# Patient Record
Sex: Female | Born: 1935 | Race: White | Hispanic: No | Smoking: Former smoker
Health system: Southern US, Community
[De-identification: ages and names within clinical notes are randomized; demographics above are authoritative.]

## PROBLEM LIST (undated history)

## (undated) DIAGNOSIS — K220 Achalasia of cardia: Secondary | ICD-10-CM

## (undated) DIAGNOSIS — E7800 Pure hypercholesterolemia, unspecified: Secondary | ICD-10-CM

## (undated) DIAGNOSIS — E538 Deficiency of other specified B group vitamins: Secondary | ICD-10-CM

## (undated) DIAGNOSIS — I10 Essential (primary) hypertension: Secondary | ICD-10-CM

## (undated) DIAGNOSIS — G629 Polyneuropathy, unspecified: Secondary | ICD-10-CM

## (undated) DIAGNOSIS — E079 Disorder of thyroid, unspecified: Secondary | ICD-10-CM

## (undated) DIAGNOSIS — E559 Vitamin D deficiency, unspecified: Secondary | ICD-10-CM

## (undated) DIAGNOSIS — R609 Edema, unspecified: Secondary | ICD-10-CM

## (undated) HISTORY — DX: Vitamin D deficiency, unspecified: E55.9

## (undated) HISTORY — DX: Achalasia of cardia: K22.0

## (undated) HISTORY — PX: BUNIONECTOMY: SHX129

## (undated) HISTORY — DX: Deficiency of other specified B group vitamins: E53.8

## (undated) HISTORY — PX: PARTIAL HYSTERECTOMY: SHX80

## (undated) HISTORY — DX: Essential (primary) hypertension: I10

## (undated) HISTORY — DX: Polyneuropathy, unspecified: G62.9

## (undated) HISTORY — DX: Edema, unspecified: R60.9

---

## 2000-05-19 ENCOUNTER — Emergency Department (HOSPITAL_COMMUNITY): Admission: EM | Admit: 2000-05-19 | Discharge: 2000-05-19 | Payer: Self-pay | Admitting: Emergency Medicine

## 2000-05-22 ENCOUNTER — Emergency Department (HOSPITAL_COMMUNITY): Admission: EM | Admit: 2000-05-22 | Discharge: 2000-05-22 | Payer: Self-pay | Admitting: Emergency Medicine

## 2000-05-26 ENCOUNTER — Encounter (HOSPITAL_COMMUNITY): Admission: RE | Admit: 2000-05-26 | Discharge: 2000-08-24 | Payer: Self-pay | Admitting: *Deleted

## 2000-12-21 ENCOUNTER — Other Ambulatory Visit: Admission: RE | Admit: 2000-12-21 | Discharge: 2000-12-21 | Payer: Self-pay | Admitting: Radiology

## 2001-02-10 ENCOUNTER — Ambulatory Visit (HOSPITAL_BASED_OUTPATIENT_CLINIC_OR_DEPARTMENT_OTHER): Admission: RE | Admit: 2001-02-10 | Discharge: 2001-02-10 | Payer: Self-pay | Admitting: Surgery

## 2001-04-19 ENCOUNTER — Other Ambulatory Visit: Admission: RE | Admit: 2001-04-19 | Discharge: 2001-04-19 | Payer: Self-pay | Admitting: *Deleted

## 2006-04-22 ENCOUNTER — Other Ambulatory Visit: Admission: RE | Admit: 2006-04-22 | Discharge: 2006-04-22 | Payer: Self-pay | Admitting: Obstetrics & Gynecology

## 2007-03-07 ENCOUNTER — Ambulatory Visit: Payer: Self-pay | Admitting: Pulmonary Disease

## 2007-03-24 ENCOUNTER — Ambulatory Visit: Payer: Self-pay | Admitting: Pulmonary Disease

## 2009-02-13 ENCOUNTER — Encounter: Admission: RE | Admit: 2009-02-13 | Discharge: 2009-02-13 | Payer: Self-pay | Admitting: Endocrinology

## 2011-04-23 NOTE — Assessment & Plan Note (Signed)
[facility]                             PULMONARY OFFICE NOTE   DESHON, KOSLOWSKI                         MRN:          [id number]  DATE:03/07/2007                            DOB:          1933/05/23    SLEEP MEDICINE CONSULTATION:   HISTORY OF PRESENT ILLNESS:  The patient is a 77 year old white female  whom I have been asked to see for persistent shortness of breath and  cough.  The patient was in her usual state of health until January of  this year when she started with a cough, which eventually became  productive of scant yellow-colored mucus.  She had no shortness of  breath at that time.  By her description it sounded a lot like an acute  viral illness.  The patient was treated with Avelox as well as a shot of  cortisone and had a negative chest x-ray at that time.  She did not see  significant improvement and was eventually seen again and placed on a  Medrol Dosepak for approximately 5 days as well as p.r.n. albuterol.  With this she felt somewhat better and then she began to have a return  of her symptoms.  She most recently was given another cortisone shot and  later placed on Symbicort, which she has been on x1 day.  The patient  states that her current symptoms consist of a dry cough with very scant  mucus, which is more hacky in nature.  She has noticed worsening  shortness of breath over the last few weeks, and this has gotten worse  over this past weekend.  She feels that this is a progressive issue.  The patient does have significant postnasal drip but no history of  chronic sinus disease or allergies.  She has occasional GERD symptoms  and has had a recent upper endoscopy by Dr. Matthias Hughs without significant  esophagitis or stricture.  She has had no history of fevers, chills or  sweats and no history of asthma in the past.   PAST MEDICAL HISTORY:  1. Hypertension.  2. History of dyslipidemia.  3. Otherwise is unremarkable.   MEDICATIONS:  1. Symbicort of unknown strength b.i.d.  2. Calcium, vitamin D and multivitamin daily.  3. Vytorin 10/40 mg daily.  4. Altace 10 mg daily.  5. Synthroid of unknown dose daily.  6. Diovan of unknown dose daily.   The patient has no known drug allergies.   SOCIAL HISTORY:  She is married and has children.  She is a retired  Camera operator.  She has a history of smoking 5 cigarettes a day for  10 years.  She has not smoked since age 56.   FAMILY HISTORY:  Remarkable for mother and father having heart disease.  Her father also had laryngeal cancer.   REVIEW OF SYSTEMS:  As per history of present illness.  Also see patient  intake form documented in the chart.   PHYSICAL EXAMINATION:  GENERAL:  She is a mildly overweight female in no  acute distress.  VITAL SIGNS:  Blood pressure is 116/74, pulse  88, temperature is 98,  weight is 156 pounds.  O2 saturation on room air is 98%.  HEENT:  Pupils equal, round and reactive to light and accommodation.  Extraocular muscles were intact.  Nares were patent without discharge.  Oropharynx is clear.  NECK:  Supple without JVD or lymphadenopathy.  There is no palpable  thyromegaly.  CHEST:  Totally clear to auscultation.  CARDIAC:  Regular rate and rhythm.  ABDOMEN:  Soft, nontender, with good bowel sounds.  GENITAL, RECTAL, BREASTS:  Exams not done and not indicated.  EXTREMITIES:  Lower extremities are without edema.  Pulses are intact  distally.  NEUROLOGIC:  Alert and oriented, no obvious observable motor defects.   LABORATORY DATA:  Chest CT was reviewed from [facility],  where there was no infiltrate, acute process, or lymphadenopathy.  There  was a tiny 4 mm density in the apical segment of the left lower lobe.  Attempts were made at spirometry today in the office with very poor test  performance because of coughing with essentially interpretable results.   IMPRESSION:  Questionable postviral  bronchiolitis.  It is really unclear  how much of her issue is from the upper airway versus the lower airway.  It certainly sounds that she had a viral illness of some type, and her  symptoms have been at least somewhat responsive to prednisone.  Both  upper airway and lower airway inflammation would respond to this.  I  have no way of knowing if she has obstructive lung disease since the  spirometry was difficult for her because of coughing.  This may simply  be all upper airway, propagated by a cyclical cough mechanism,  laryngopharyngeal reflux related to her worsening cough, as well as  stimulation of the upper airway related to Altace.  She has no obvious  infiltrate and nothing to indicate that she needs further antibiotics.  At this point in time I would like to treat her with another course of  prednisone over approximately 8 days and also try to minimize  stimulation to her upper airway as much as possible with a proton pump  inhibitor and discontinuing her ACE inhibitor for now.  We will see how  the patient responds over the next 3 weeks.   PLAN:  1. Initiate Zegerid 40 mg p.o. q.h.s. for the next 3 weeks.  2. Discontinue Altace for now.  3. Prednisone taper over the next 8 days.  4. The patient was will follow up in 3 weeks or sooner if there are      problems.    [name], MD,FCCP  Electronically Signed   [facility]  DD: 03/07/2007  DT: 03/08/2007  Job #: [id number]   cc:   Tera Mater. Evlyn Kanner, M.D.

## 2011-04-23 NOTE — Op Note (Signed)
[place]. [facility]  Patient:    Tami Cabrera, Tami Cabrera                         MRN: [id number] Proc. Date: 02/10/01 Adm. Date:  [id number] Attending:  Charlton Haws CC:         Tera Mater. Evlyn Kanner, M.D.             Andres Ege, M.D.             Jeralyn Ruths, M.D.                           Operative Report  CCS# [id number]  PREOPERATIVE DIAGNOSIS:  Nipple discharge right.  POSTOPERATIVE DIAGNOSIS:  Nipple discharge right.  OPERATION PERFORMED:  Excision, ductal tissue right breast.  SURGEON:  Currie Paris, M.D.  ANESTHESIA:  General.  INDICATIONS FOR PROCEDURE:  The patient is a 77 year old lady with significant discharge from a particular duct in the right breast although she has had nipple discharge from multiple ducts in both breasts.  The 9 oclock positino has been somewhat worrisome.  DESCRIPTION OF PROCEDURE:  The patient was brought to the operating room and the area in question was identified and the breast manipulated to extract a little bit of nipple discharge and the area marked.  She was then given a satisfactory general anesthesia (LMA).  The breast was prepped and draped as a sterile field.  I was able to get a small tear duct probe into the aforementioned duct and with a little manipulation was actually able to get all the way to the hilt.  It tracked laterally from this 9 oclock position.  A curvilinear incision was made at the areolar margin and the duct identified. I divided the tissue with cautery around the tear duct probe and was able to palpate the tear duct probe in its general direction in the breast.  I disconnected this tissue from the underside of the nipple and then withdrew the tear duct probe.  I attempted to get it back into the duct after I disconnected it but was unable to rethread it back in, but had already identified visually the area involved.  Using cutting current of the cautery, I took a wide  excision of tissue almost to the chest wall going posteriorly and laterally around the area involved. Almost all of this tissue appeared to be fatty fibrous tissue with strands of breast tissue interspersed.  This specimen was oriented by putting a suture at the nipple end.  I had noticed some more thickened material there as we cut across the duct than the watery thin discharge that had been coming out of the nipple itself. Inspection of the undersurface of the nipple still revealed what to me looked like some abnormal ductal tissue and using the knife, I excised a biopsy of this tissue getting through the skin in one point which was then closed.  This actually had some very thick creamy material in it somewhat worrisome for DCIS comedo type material although it could well also represent just some severe fibrocystic change.  The wounds were closed.  I reclosed the nipple with a little 4-0 subcuticular Monocryl.  The breast was checked for hemostasis and when dry was closed with 3-0 Vicryl followed by 4-0 Monocryl subcuticular.  Sterile dressings were applied.  The patient tolerated the procedure well.  There were no operative complications.  All  counts were correct. DD:  02/10/01 TD:  02/10/01 Job: [id number]

## 2011-04-23 NOTE — Assessment & Plan Note (Signed)
[facility]                             PULMONARY OFFICE NOTE   Tami Cabrera, Tami Cabrera                         MRN:          [id number]  DATE:03/24/2007                            DOB:          [birth year]    SUBJECTIVE:  Tami Cabrera comes in today for follow-up after her last  consultation where she is having cough and other pulmonary symptoms.  It  was unclear at that time whether she had a post viral bronchiolitis or  how much that upper airway dysfunction and irritation could be adding to  her symptoms.  Patient was started on Zegerid 40 mg nightly, asked her  to discontinue the Altace and also placed on prednisone taper over  approximately a week.  She returns today where she is doing much, much  better.  Her cough is improved at last 80% and she is satisfied with the  direction in which it is going.  She has no shortness of breath.   PHYSICAL EXAMINATION:  VITAL SIGNS:  Blood pressure 104/62, pulse 77,  temperature 98.1, weight 158 pounds, O2 saturation on room air 98%.  CHEST:  Totally clear.  CARDIOVASCULAR:  Regular rate and rhythm.   IMPRESSION:  1. Cough and other upper airway symptoms that I suspect was primarily      secondary to reflux disease and the ACE inhibitor.  There may have      also been an element of post viral bronchiolitis.  At this point in      time, the patient is at least 80% improved.  I think she needs to      stay off her ACE inhibitor and also stay on the Zegerid for the      next 2-3 weeks to give her a good 6-8 weeks course.  2. Very small pleural based density of 4 mm in the superior segment of      the left lower lobe.  I have had a long discussion with the patient      about this and feel that she is at extremely low risk.  The patient      would like to follow this up for a short period of time just for      her own piece of mind.  Again, I have explained to her it is      unlikely to amount to anything but we will  follow it over the next      one year.   PLAN:  1. Stay on Zegerid 40 mg daily for the next 2-3 weeks and then      discontinue.  If she sees her symptoms starting to return, she      needs to get back on the medication.  2. The patient will call me in August to set up a limited CT scan of      the chest in September of this year to follow up her left lower      lobe nodule.     Tami Share, MD,FCCP  Electronically Signed    [facility]  DD:  03/29/2007  DT: 03/29/2007  Job #: [id number]   cc:   Jeannett Senior A. Evlyn Kanner, M.D.

## 2012-08-27 ENCOUNTER — Ambulatory Visit (INDEPENDENT_AMBULATORY_CARE_PROVIDER_SITE_OTHER): Payer: Medicare Other | Admitting: Family Medicine

## 2012-08-27 ENCOUNTER — Ambulatory Visit: Payer: Medicare Other

## 2012-08-27 VITALS — BP 114/57 | HR 70 | Temp 97.5°F | Resp 16 | Ht 64.5 in | Wt 147.0 lb

## 2012-08-27 DIAGNOSIS — S93609A Unspecified sprain of unspecified foot, initial encounter: Secondary | ICD-10-CM

## 2012-08-27 DIAGNOSIS — M7989 Other specified soft tissue disorders: Secondary | ICD-10-CM

## 2012-08-27 NOTE — Patient Instructions (Addendum)
Foot Sprain  The muscles and cord like structures which attach muscle to bone (tendons) that surround the feet are made up of units. A foot sprain can occur at the weakest spot in any of these units. This condition is most often caused by injury to or overuse of the foot, as from playing contact sports, or aggravating a previous injury, or from poor conditioning, or obesity.  SYMPTOMS  · Pain with movement of the foot.  · Tenderness and swelling at the injury site.  · Loss of strength is present in moderate or severe sprains.  THE THREE GRADES OR SEVERITY OF FOOT SPRAIN ARE:  · Mild (Grade I): Slightly pulled muscle without tearing of muscle or tendon fibers or loss of strength.  · Moderate (Grade II): Tearing of fibers in a muscle, tendon, or at the attachment to bone, with small decrease in strength.  · Severe (Grade III): Rupture of the muscle-tendon-bone attachment, with separation of fibers. Severe sprain requires surgical repair. Often repeating (chronic) sprains are caused by overuse. Sudden (acute) sprains are caused by direct injury or over-use.  DIAGNOSIS   Diagnosis of this condition is usually by your own observation. If problems continue, a caregiver may be required for further evaluation and treatment. X-rays may be required to make sure there are not breaks in the bones (fractures) present. Continued problems may require physical therapy for treatment.  PREVENTION  · Use strength and conditioning exercises appropriate for your sport.  · Warm up properly prior to working out.  · Use athletic shoes that are made for the sport you are participating in.  · Allow adequate time for healing. Early return to activities makes repeat injury more likely, and can lead to an unstable arthritic foot that can result in prolonged disability. Mild sprains generally heal in 3 to 10 days, with moderate and severe sprains taking 2 to 10 weeks. Your caregiver can help you determine the proper time required for  healing.  HOME CARE INSTRUCTIONS   · Apply ice to the injury for 15 to 20 minutes, 3 to 4 times per day. Put the ice in a plastic bag and place a towel between the bag of ice and your skin.  · An elastic wrap (like an Ace bandage) may be used to keep swelling down.  · Keep foot above the level of the heart, or at least raised on a footstool, when swelling and pain are present.  · Try to avoid use other than gentle range of motion while the foot is painful. Do not resume use until instructed by your caregiver. Then begin use gradually, not increasing use to the point of pain. If pain does develop, decrease use and continue the above measures, gradually increasing activities that do not cause discomfort, until you gradually achieve normal use.  · Use crutches if and as instructed, and for the length of time instructed.  · Keep injured foot and ankle wrapped between treatments.  · Massage foot and ankle for comfort and to keep swelling down. Massage from the toes up towards the knee.  · Only take over-the-counter or prescription medicines for pain, discomfort, or fever as directed by your caregiver.  SEEK IMMEDIATE MEDICAL CARE IF:   · Your pain and swelling increase, or pain is not controlled with medications.  · You have loss of feeling in your foot or your foot turns cold or blue.  · You develop new, unexplained symptoms, or an increase of the symptoms that brought you   to your caregiver.  MAKE SURE YOU:   · Understand these instructions.  · Will watch your condition.  · Will get help right away if you are not doing well or get worse.  Document Released: 05/14/2002 Document Revised: 11/11/2011 Document Reviewed: 07/11/2008  ExitCare® Patient Information ©2012 [employer].

## 2012-08-27 NOTE — Progress Notes (Signed)
78 yo woman who twisted foot one week ago but the foot has remained swollen.  There is not a lot of pain.   The foot has turned black and blue and is tender on the dorsal, proximal foot.  Objective:  NAD 2 to 3+ foot edema with obvious ecchymosis distal dorsal foot and middle three toes FROM of foot Tender over cuboid bone dorsally Good pulse on DP [facility] reading (PRIMARY) by  Dr. Milus Glazier:  Right foot. No fracture  Assessment:  Foot sprain, right.    Plan:  Wear supportive shoes.  I don't think ibuprofen or other anti inflammatory agents are necessary

## 2014-03-13 ENCOUNTER — Telehealth: Payer: Self-pay | Admitting: Neurology

## 2014-03-13 NOTE — Telephone Encounter (Signed)
Called patient back and left message. This is not a patient of our office. I believe this is a patient's wife. Awaiting call back.

## 2014-03-13 NOTE — Telephone Encounter (Signed)
Calling for her husband - please document in his chart. She is not a patient here.

## 2014-03-13 NOTE — Telephone Encounter (Signed)
Pt wants to talk to you asap please call her at [phone]

## 2014-03-13 NOTE — Telephone Encounter (Signed)
Returning your call. °

## 2014-05-17 ENCOUNTER — Encounter (HOSPITAL_COMMUNITY): Payer: Self-pay | Admitting: Emergency Medicine

## 2014-05-17 ENCOUNTER — Emergency Department (HOSPITAL_COMMUNITY)
Admission: EM | Admit: 2014-05-17 | Discharge: 2014-05-17 | Disposition: A | Discharge status: Home / Self Care | Payer: Medicare Other | Attending: Emergency Medicine | Admitting: Emergency Medicine

## 2014-05-17 DIAGNOSIS — Z203 Contact with and (suspected) exposure to rabies: Secondary | ICD-10-CM

## 2014-05-17 DIAGNOSIS — Z7982 Long term (current) use of aspirin: Secondary | ICD-10-CM | POA: Insufficient documentation

## 2014-05-17 DIAGNOSIS — Z23 Encounter for immunization: Secondary | ICD-10-CM | POA: Insufficient documentation

## 2014-05-17 DIAGNOSIS — Z87891 Personal history of nicotine dependence: Secondary | ICD-10-CM | POA: Insufficient documentation

## 2014-05-17 MED ORDER — RABIES VACCINE, PCEC IM SUSR
1.0000 mL | Freq: Once | INTRAMUSCULAR | Status: AC
  Administered 2014-05-17: 1 mL via INTRAMUSCULAR
  Filled 2014-05-17: qty 1

## 2014-05-17 NOTE — Discharge Instructions (Signed)
You need to have rabies vaccine again in 3 days Rabies  Rabies is a viral infection that can be spread to people from infected animals. The infection affects the brain and central nervous system. Once the disease develops, it almost always causes death. Because of this, when a person is bitten by an animal that may have rabies, treatment to prevent rabies often needs to be started whether or not the animal is known to be infected. Prompt treatment with the rabies vaccine and rabies immune globulin is very effective at preventing the infection from developing in people who have been exposed to the rabies virus. CAUSES  Rabies is caused by a virus that lives inside some animals. When a person is bitten by an infected animal, the rabies virus is spread to the person through the infected spit (saliva) of the animal. This virus can be carried by animals such as dogs, cats, skunks, bats, woodchucks, raccoons, coyotes, and foxes. SYMPTOMS  By the time symptoms appear, rabies is usually fatal for the person. Common symptoms include:  Headache.  Fever.  Fatigue and weakness.  Agitation.  Anxiety.  Confusion.  Unusual behavior, such as hyperactivity, fear of water (hydrophobia), or fear of air (aerophobia).  Hallucinations.  Insomnia.  Weakness in the arms or legs.  Difficulty swallowing. Most people get sick in 1 3 months after being bitten. This often varies and may depend on the location of the bite. The infection will take less time to develop if the bite occurred closer to the head.  DIAGNOSIS  To determine if a person is infected, several tests must be performed, such as:  A skin biopsy.  A saliva test.  A lumbar puncture to remove spinal fluid so it can be examined.  Blood tests. TREATMENT  Treatment to prevent the infection from developing (post-exposure prophylaxis, PEP) is often started before knowing for sure if the person has been exposed to the rabies virus. PEP involves  cleaning the wound, giving an antibody injection (rabies immune globulin), and giving a series of rabies vaccine injections. The series of injections are usually given over a two-week period. If possible, the animal that bit the person will be observed to see if it remains healthy. If the animal has been killed, it can be sent to a state laboratory and examined to see if the animal had rabies. If a person is bitten by a domestic animal (dog, cat, or ferret) that appears healthy and can be observed to see if it remains healthy, often no further treatment is necessary other than care of the wounds caused by the animal. Rabies is often a fatal illness once the infection develops in a person. Although a few people who developed rabies have survived after experimental treatment with certain drugs, all these survivors still had severe nervous system problems after the treatment. This is why caregivers use extra caution and begin PEP treatment for people who have been bitten by animals that are possibly infected with rabies.  HOME CARE INSTRUCTIONS  If you were bitten by an unknown animal, make sure you know your caregiver's instructions for follow-up. If the animal was sent to a laboratory for examination, ask when the test results will be ready. Make sure you get the test results.  Take these steps to care for your wound:  Keep the wound clean, dry, and dressed as directed by your caregiver.  Keep the injured part elevated as much as possible.  Do not resume use of the affected area until  directed.  Only take over-the-counter or prescription medicines as directed by your caregiver.  Keep all follow-up appointments as directed by your caregiver. PREVENTION  To prevent rabies, people need to reduce their risk of having contact with infected animals.   Make sure your pets (dogs, cats, ferrets) are vaccinated against rabies. Keep these vaccinations up-to-date as directed by your veterinarian.  Supervise  your pets when they are outside. Keep them away from wild animals.  Call your local animal control services to report any stray animals. These animals may not be vaccinated.  Stay away from stray or wild animals.  Consider getting the rabies vaccine (preexposure) if you are traveling to an area where rabies is common or if your job or activities involve possible contact with wild or stray animals. Discuss this with your caregiver. Document Released: 11/22/2005 Document Revised: 08/16/2012 Document Reviewed: 06/20/2012 [facility] Patient Information 2014 [place], [place].

## 2014-05-17 NOTE — ED Provider Notes (Signed)
Medical screening examination/treatment/procedure(s) were performed by non-physician practitioner and as supervising physician I was immediately available for consultation/collaboration.   EKG Interpretation None        Junius ArgyleForrest S Fin Hupp, MD 05/17/14 1845

## 2014-05-17 NOTE — ED Notes (Addendum)
Pt reports that she has seen bats in her home for the past two weeks. Pt reports seeing two dead bats in her basement and another flying in her bedroom. Pt reports a history of being immunized for rabies [facility]. Pt is A/O x4, in NAD, and vitals are WDL.

## 2014-05-17 NOTE — ED Provider Notes (Signed)
CSN: [id number]     Arrival date & time 05/17/14  1635 History  This chart was scribed for non-physician practitioner, Teressa LowerVrinda Maresa Morash, FNP,working with Junius ArgyleForrest S Harrison, MD, by Karle PlumberJennifer Tensley, ED Scribe.  This patient was seen in room WTR7/WTR7 and the patient's care was started at 4:55 PM.  Chief Complaint  Patient presents with  . Rabies Injection   The history is provided by the patient. No language interpreter was used.   HPI Comments:  Tami Cabrera is a 80 y.o. female who presents to the Emergency Department complaining of being exposed to bats in the past two weeks. Pt states she found some dead bats in her basement and one live one in her bedroom. She states she was vaccinated previously for a bat exposure but does not specify when. She denies fever, nausea, or vomiting.  History reviewed. No pertinent past medical history. History reviewed. No pertinent past surgical history. No family history on file. History  Substance Use Topics  . Smoking status: Former Smoker    Quit date: 08/27/1977  . Smokeless tobacco: Not on file  . Alcohol Use: Not on file   OB History   Grav Para Term Preterm Abortions TAB SAB Ect Mult Living                 Review of Systems  Constitutional: Negative for fever.  Gastrointestinal: Negative for nausea and vomiting.  All other systems reviewed and are negative.   Allergies  Review of patient's allergies indicates no known allergies.  Home Medications   Prior to Admission medications   Medication Sig Start Date End Date Taking? Authorizing Provider  aspirin 325 MG tablet Take 325 mg by mouth daily.    Historical Provider, MD  Ezetimibe-Simvastatin (VYTORIN PO) Take by mouth.    Historical Provider, MD  Levothyroxine Sodium (SYNTHROID PO) Take by mouth.    Historical Provider, MD  Valsartan (DIOVAN PO) Take by mouth.    Historical Provider, MD   Triage Vitals: BP 121/58  Pulse 73  Temp(Src) 98.9 F (37.2 C) (Oral)  Resp 18  SpO2  98% Physical Exam  Nursing note and vitals reviewed. Constitutional: She is oriented to person, place, and time. She appears well-developed and well-nourished.  HENT:  Head: Normocephalic and atraumatic.  Eyes: EOM are normal.  Neck: Normal range of motion.  Cardiovascular: Normal rate.   Pulmonary/Chest: Effort normal.  Musculoskeletal: Normal range of motion.  Neurological: She is alert and oriented to person, place, and time.  Skin: Skin is warm and dry.  Psychiatric: She has a normal mood and affect. Her behavior is normal.    ED Course  Procedures (including critical care time) DIAGNOSTIC STUDIES: Oxygen Saturation is 98% on RA, normal by my interpretation.   COORDINATION OF CARE: 4:57 PM- Will administer the rabies vaccine. Pt verbalizes understanding and agrees to plan.  Medications - No data to display  Labs Review Labs Reviewed - No data to display  Imaging Review No results found.   EKG Interpretation None      MDM   Final diagnoses:  Rabies exposure   Pt to receive 2 vaccines as she has already been vaccinated before. No known bites  I personally performed the services described in this documentation, which was scribed in my presence. The recorded information has been reviewed and is accurate.    Teressa LowerVrinda Lakeyia Surber, NP 05/17/14 1704

## 2014-05-20 ENCOUNTER — Encounter (HOSPITAL_COMMUNITY): Payer: Self-pay | Admitting: Emergency Medicine

## 2014-05-20 ENCOUNTER — Emergency Department (HOSPITAL_COMMUNITY)
Admission: EM | Admit: 2014-05-20 | Discharge: 2014-05-20 | Disposition: A | Discharge status: Home / Self Care | Payer: Medicare Other | Source: Home / Self Care

## 2014-05-20 DIAGNOSIS — Z203 Contact with and (suspected) exposure to rabies: Secondary | ICD-10-CM

## 2014-05-20 MED ORDER — RABIES VACCINE, PCEC IM SUSR
1.0000 mL | Freq: Once | INTRAMUSCULAR | Status: AC
  Administered 2014-05-20: 1 mL via INTRAMUSCULAR

## 2014-05-20 MED ORDER — RABIES VACCINE, PCEC IM SUSR
INTRAMUSCULAR | Status: AC
  Filled 2014-05-20: qty 1

## 2014-05-20 NOTE — ED Notes (Signed)
Here for 2nd and final shot in post immunization re-exposure series <NAD

## 2014-05-23 ENCOUNTER — Ambulatory Visit: Payer: Medicare Other | Admitting: Podiatry

## 2014-06-03 ENCOUNTER — Ambulatory Visit (INDEPENDENT_AMBULATORY_CARE_PROVIDER_SITE_OTHER): Payer: Medicare Other

## 2014-06-03 ENCOUNTER — Ambulatory Visit (INDEPENDENT_AMBULATORY_CARE_PROVIDER_SITE_OTHER): Payer: Medicare Other | Admitting: Podiatry

## 2014-06-03 DIAGNOSIS — M779 Enthesopathy, unspecified: Secondary | ICD-10-CM

## 2014-06-03 DIAGNOSIS — M21619 Bunion of unspecified foot: Secondary | ICD-10-CM

## 2014-06-03 DIAGNOSIS — M21611 Bunion of right foot: Secondary | ICD-10-CM

## 2014-06-03 MED ORDER — TRIAMCINOLONE ACETONIDE 10 MG/ML IJ SUSP
10.0000 mg | Freq: Once | INTRAMUSCULAR | Status: AC
  Administered 2014-06-03: 10 mg

## 2014-06-03 NOTE — Progress Notes (Signed)
   Subjective:    Patient ID: Tami LaineNancy Gardiner, female    DOB: 09-22-1933, 80 y.o.   MRN: [id number]  HPI  Pt states that she thinks she has a bunion on her right foot, has noticed it worsening over the past 6 months and it is causing her second toe to shift. States that she has pain later in the day, and when she wears closed toe shoes.   Review of Systems  All other systems reviewed and are negative.      Objective:   Physical Exam        Assessment & Plan:

## 2014-06-03 NOTE — Progress Notes (Signed)
Subjective:     Patient ID: Tami Cabrera, female   DOB: July 12, 1933, 80 y.o.   MRN: [id number]  Foot Pain   patient states I have a bunion on my right foot that has become sore and my big toe is starting to press against the second toe. I know him to need to have it fixed I like to see what else we can do temporarily   Review of Systems  All other systems reviewed and are negative.      Objective:   Physical Exam  Nursing note and vitals reviewed. Constitutional: She is oriented to person, place, and time.  Cardiovascular: Intact distal pulses.   Musculoskeletal: Normal range of motion.  Neurological: She is oriented to person, place, and time.  Skin: Skin is warm.   neurovascular status intact with muscle strength adequate and range of motion subtalar joint midtarsal joint within normal limits. Digits are found to be well perfused and arch height is normal on weightbearing. 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 I have recommended capsular injection which was accomplished today with consideration of fixing this in the fall or winter. Patient wants to have it fixed we will see first response to the medication site

## 2014-09-16 ENCOUNTER — Ambulatory Visit: Payer: Medicare Other | Admitting: Podiatry

## 2014-09-16 ENCOUNTER — Encounter: Payer: Self-pay | Admitting: Podiatry

## 2014-09-16 ENCOUNTER — Ambulatory Visit (INDEPENDENT_AMBULATORY_CARE_PROVIDER_SITE_OTHER): Payer: Medicare Other | Admitting: Podiatry

## 2014-09-16 VITALS — BP 137/75 | HR 72 | Resp 16

## 2014-09-16 DIAGNOSIS — M2011 Hallux valgus (acquired), right foot: Secondary | ICD-10-CM

## 2014-09-16 DIAGNOSIS — M21611 Bunion of right foot: Secondary | ICD-10-CM

## 2014-09-16 NOTE — Patient Instructions (Signed)
Pre-Operative Instructions  Congratulations, you have decided to take an important step to improving your quality of life.  You can be assured that the doctors of [facility] will be with you every step of the way.  1. Plan to be at the surgery center/hospital at least 1 (one) hour prior to your scheduled time unless otherwise directed by the surgical center/hospital staff.  You must have a responsible adult accompany you, remain during the surgery and drive you home.  Make sure you have directions to the surgical center/hospital and know how to get there on time. 2. For hospital based surgery you will need to obtain a history and physical form from your family physician within 1 month prior to the date of surgery- we will give you a form for you primary physician.  3. We make every effort to accommodate the date you request for surgery.  There are however, times where surgery dates or times have to be moved.  We will contact you as soon as possible if a change in schedule is required.   4. No Aspirin/Ibuprofen for one week before surgery.  If you are on aspirin, any non-steroidal anti-inflammatory medications (Mobic, Aleve, Ibuprofen) you should stop taking it 7 days prior to your surgery.  You make take Tylenol  For pain prior to surgery.  5. Medications- If you are taking daily heart and blood pressure medications, seizure, reflux, allergy, asthma, anxiety, pain or diabetes medications, make sure the surgery center/hospital is aware before the day of surgery so they may notify you which medications to take or avoid the day of surgery. 6. No food or drink after midnight the night before surgery unless directed otherwise by surgical center/hospital staff. 7. No alcoholic beverages 24 hours prior to surgery.  No smoking 24 hours prior to or 24 hours after surgery. 8. Wear loose pants or shorts- loose enough to fit over bandages, boots, and casts. 9. No slip on shoes, sneakers are best. 10. Bring  your boot with you to the surgery center/hospital.  Also bring crutches or a walker if your physician has prescribed it for you.  If you do not have this equipment, it will be provided for you after surgery. 11. If you have not been contracted by the surgery center/hospital by the day before your surgery, call to confirm the date and time of your surgery. 12. Leave-time from work may vary depending on the type of surgery you have.  Appropriate arrangements should be made prior to surgery with your employer. 13. Prescriptions will be provided immediately following surgery by your doctor.  Have these filled as soon as possible after surgery and take the medication as directed. 14. Remove nail polish on the operative foot. 15. Wash the night before surgery.  The night before surgery wash the foot and leg well with the antibacterial soap provided and water paying special attention to beneath the toenails and in between the toes.  Rinse thoroughly with water and dry well with a towel.  Perform this wash unless told not to do so by your physician.  Enclosed: 1 Ice pack (please put in freezer the night before surgery)   1 Hibiclens skin cleaner   Pre-op Instructions  If you have any questions regarding the instructions, do not hesitate to call our office.  [place]: [address] [place], [place] [address] [phone]  [place]: [address] [place], [place] [address] [phone]  [place]: [address]  [place], [place] [address] [phone]  Dr. Richard   Tuchman DPM, Dr. Norman Regal DPM Dr. Richard Sikora DPM, Dr. M. Todd Hyatt DPM, Dr. Kathryn Egerton DPM 

## 2014-09-17 NOTE — Progress Notes (Signed)
Subjective:     Patient ID: Tami Cabrera, female   DOB: [birth year], 80 y.o.   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 Explained to the patient the procedure and risk and reviewed with her the consent form going over all possible complications as outlined. Patient wants surgery understanding the consent form and risk and signs consent form. Total recovery. Can take 6 months to one year and patient is aware of this and is given all preoperative instructions at this time.

## 2014-10-01 ENCOUNTER — Encounter: Payer: Self-pay | Admitting: Podiatry

## 2014-10-01 DIAGNOSIS — M2011 Hallux valgus (acquired), right foot: Secondary | ICD-10-CM

## 2014-10-06 NOTE — Progress Notes (Signed)
Dr Charlsie Merlesegal performed a right Keller/Mcbride bunionectomy on 10/01/14

## 2014-10-07 ENCOUNTER — Encounter: Payer: Medicare Other | Admitting: Podiatry

## 2014-10-08 ENCOUNTER — Encounter: Payer: Self-pay | Admitting: Podiatry

## 2014-10-08 ENCOUNTER — Ambulatory Visit (INDEPENDENT_AMBULATORY_CARE_PROVIDER_SITE_OTHER): Payer: Medicare Other

## 2014-10-08 ENCOUNTER — Ambulatory Visit (INDEPENDENT_AMBULATORY_CARE_PROVIDER_SITE_OTHER): Payer: Medicare Other | Admitting: Podiatry

## 2014-10-08 ENCOUNTER — Encounter: Payer: Medicare Other | Admitting: Podiatry

## 2014-10-08 VITALS — BP 103/57 | HR 76 | Resp 16

## 2014-10-08 DIAGNOSIS — M2011 Hallux valgus (acquired), right foot: Secondary | ICD-10-CM

## 2014-10-08 NOTE — Progress Notes (Signed)
Subjective:     Patient ID: Tami Cabrera, female   DOB: 1933-07-22, 80 y.o.   MRN: [id number]  HPIpatient states I'm doing great with almost no pain. States that she is able to walk with no achiness or swelling of her foot   Review of Systems     Objective:   Physical Exam Neurovascular status intact with well-healing surgical site right first metatarsal with wound edges coapted and good alignment    Assessment:     Healing well from bunion surgery right    Plan:     Reviewed x-rays and applied sterile dressing and continue with surgical shoe for several more weeks and reappoint us in 4 weeks earlier if any issues should occur

## 2014-11-04 ENCOUNTER — Encounter: Payer: Self-pay | Admitting: Podiatry

## 2014-11-04 ENCOUNTER — Ambulatory Visit (INDEPENDENT_AMBULATORY_CARE_PROVIDER_SITE_OTHER): Payer: Medicare Other | Admitting: Podiatry

## 2014-11-04 ENCOUNTER — Ambulatory Visit (INDEPENDENT_AMBULATORY_CARE_PROVIDER_SITE_OTHER): Payer: Medicare Other

## 2014-11-04 VITALS — BP 113/61 | HR 75 | Resp 16

## 2014-11-04 DIAGNOSIS — M2011 Hallux valgus (acquired), right foot: Secondary | ICD-10-CM

## 2014-11-04 NOTE — Progress Notes (Signed)
Subjective:     Patient ID: Tami LaineNancy Bjelland, female   DOB: June 21, 1933, 80 y.o.   MRN: [id number]  HPI patient states that she's doing fine but still having a little discomfort with certain shoe gear   Review of Systems     Objective:   Physical Exam Neurovascular status unchanged with negative Homans sign noted and noted to have well-healing surgical site first metatarsal right with minimal edema noted and good range of motion of the first MPJ    Assessment:     Healing well with mild edema still noted    Plan:     Instructed on wider-type shoes elevation and compression as needed. Patient will be seen back to recheck again in 6 weeks earlier if any issues should occur and reviewed x-rays with her

## 2014-12-16 ENCOUNTER — Ambulatory Visit (INDEPENDENT_AMBULATORY_CARE_PROVIDER_SITE_OTHER): Payer: Medicare Other

## 2014-12-16 ENCOUNTER — Encounter: Payer: Self-pay | Admitting: Podiatry

## 2014-12-16 ENCOUNTER — Ambulatory Visit (INDEPENDENT_AMBULATORY_CARE_PROVIDER_SITE_OTHER): Payer: Medicare Other | Admitting: Podiatry

## 2014-12-16 VITALS — BP 90/45 | HR 79 | Resp 16

## 2014-12-16 DIAGNOSIS — M2011 Hallux valgus (acquired), right foot: Secondary | ICD-10-CM

## 2014-12-18 NOTE — Progress Notes (Signed)
Subjective:     Patient ID: Tami LaineNancy Cabrera, female   DOB: 02-23-1933, 81 y.o.   MRN: [id number]  HPI patient presents stating that my right foot is doing better with occasional discomfort   Review of Systems     Objective:   Physical Exam Neurovascular status intact good structural alignment with satisfactory section of eminence from the right first metatarsal    Assessment:     Doing well post McBride bunionectomy right    Plan:     Advised on physical therapy and wider shoes and reappoint for us to recheck again

## 2015-07-18 ENCOUNTER — Encounter: Payer: Self-pay | Admitting: Podiatry

## 2018-07-11 ENCOUNTER — Other Ambulatory Visit (HOSPITAL_COMMUNITY): Payer: Self-pay | Admitting: Endocrinology

## 2018-07-11 DIAGNOSIS — R609 Edema, unspecified: Secondary | ICD-10-CM

## 2018-07-13 ENCOUNTER — Other Ambulatory Visit: Payer: Self-pay

## 2018-07-13 ENCOUNTER — Ambulatory Visit (HOSPITAL_COMMUNITY): Payer: Medicare Other | Attending: Cardiovascular Disease

## 2018-07-13 DIAGNOSIS — R609 Edema, unspecified: Secondary | ICD-10-CM | POA: Diagnosis present

## 2018-07-13 DIAGNOSIS — R5383 Other fatigue: Secondary | ICD-10-CM | POA: Insufficient documentation

## 2018-07-13 DIAGNOSIS — R0609 Other forms of dyspnea: Secondary | ICD-10-CM | POA: Diagnosis not present

## 2018-08-04 ENCOUNTER — Ambulatory Visit (HOSPITAL_COMMUNITY)
Admission: EM | Admit: 2018-08-04 | Discharge: 2018-08-04 | Disposition: A | Discharge status: Home / Self Care | Payer: Medicare Other | Attending: Family Medicine | Admitting: Family Medicine

## 2018-08-04 ENCOUNTER — Encounter (HOSPITAL_COMMUNITY): Payer: Self-pay | Admitting: Emergency Medicine

## 2018-08-04 DIAGNOSIS — M5431 Sciatica, right side: Secondary | ICD-10-CM | POA: Diagnosis not present

## 2018-08-04 HISTORY — DX: Disorder of thyroid, unspecified: E07.9

## 2018-08-04 HISTORY — DX: Pure hypercholesterolemia, unspecified: E78.00

## 2018-08-04 MED ORDER — PREDNISONE 20 MG PO TABS: ORAL_TABLET | ORAL | 0 refills | Status: DC

## 2018-08-04 NOTE — ED Triage Notes (Signed)
Pt states on Monday she started having R hip pain that shoots down her leg. Pt states pain is worse when sitting.

## 2018-08-04 NOTE — ED Provider Notes (Signed)
[facility]    CSN: [id number] Arrival date & time: 08/04/18  1906     History   Chief Complaint Chief Complaint  Patient presents with  . Hip Pain    HPI Tami Cabrera is a 84 y.o. female.   Pt states on Monday she started having R hip pain that shoots down her leg. Pt states pain is worse when sitting.   Patient has had no accident or sudden fall.  She said no weakness in the leg.  She does have some tingling in the great toe area.  Patient has never had this problem before.  She has had no swelling in her calf.  She has had hallux valgus surgery in the past and the swelling in her great toe area is normal for her.  Patient said no chest pain or shortness of breath.  Patient went on a trip to the mountains with her friends over the last 5 days and has been walking more than normal.     Past Medical History:  Diagnosis Date  . High cholesterol   . Thyroid disease     There are no active problems to display for this patient.   History reviewed. No pertinent surgical history.  OB History   None      Home Medications    Prior to Admission medications   Medication Sig Start Date End Date Taking? Authorizing Provider  MELOXICAM PO Take by mouth.   Yes [provider]  aspirin 325 MG tablet Take 325 mg by mouth daily.    [provider]  Ezetimibe-Simvastatin (VYTORIN PO) Take by mouth.    [provider]  HYDROcodone-acetaminophen (NORCO/VICODIN) 5-325 MG per tablet Take 1-2 tablets by mouth every 6 (six) hours as needed for moderate pain.    Lenn Sink, DPM  Levothyroxine Sodium (SYNTHROID PO) Take by mouth.    [provider]  predniSONE (DELTASONE) 20 MG tablet Two daily with food 08/04/18   Elvina Sidle, MD  Valsartan (DIOVAN PO) Take by mouth.    [provider]    Family History No family history on file.  Social History Social History   Tobacco Use  . Smoking status: Former Smoker   Last attempt to quit: 08/27/1977    Years since quitting: 40.9  Substance Use Topics  . Alcohol use: Not on file  . Drug use: Not on file     Allergies   Patient has no known allergies.   Review of Systems Review of Systems   Physical Exam Triage Vital Signs ED Triage Vitals [08/04/18 1919]  Enc Vitals Group     BP (!) 168/90     Pulse Rate 75     Resp 16     Temp 98 F (36.7 C)     Temp src      SpO2 100 %     Weight      Height      Head Circumference      Peak Flow      Pain Score      Pain Loc      Pain Edu?      Excl. in GC?    No data found.  Updated Vital Signs BP (!) 168/90   Pulse 75   Temp 98 F (36.7 C)   Resp 16   SpO2 100%    Physical Exam  Constitutional: She is oriented to person, place, and time. She appears well-developed and well-nourished.  HENT:  Right Ear: External ear normal.  Left Ear: External ear normal.  Eyes: Pupils are equal, round, and reactive to light. Conjunctivae are normal.  Neck: Normal range of motion. Neck supple.  Pulmonary/Chest: Effort normal.  Musculoskeletal:  Negative straight leg raising Negative pelvic twist Negative palpation over the greater trochanter or sacroiliac joint  Neurological: She is alert and oriented to person, place, and time.  Skin: Skin is warm and dry.  Nursing note and vitals reviewed.    UC Treatments / Results  Labs (all labs ordered are listed, but only abnormal results are displayed) Labs Reviewed - No data to display  EKG None  Radiology No results found.  Procedures Procedures (including critical care time)  Medications Ordered in UC Medications - No data to display  Initial Impression / Assessment and Plan / UC Course  I have reviewed the triage vital signs and the nursing notes.  Pertinent labs & imaging results that were available during my care of the patient were reviewed by me and considered in my medical decision making (see chart for details).     Final  Clinical Impressions(s) / UC Diagnoses   Final diagnoses:  Sciatica of right side     Discharge Instructions     You should be seeing significant improvement over the next 2 to 3 days.  If not, please return for x-rays    ED Prescriptions    Medication Sig Dispense Auth. Provider   predniSONE (DELTASONE) 20 MG tablet Two daily with food 10 tablet Elvina SidleLauenstein, Alyanah Elliott, MD     Controlled Substance Prescriptions [place] Controlled Substance Registry consulted? Not Applicable   Elvina SidleLauenstein, Alonna Bartling, MD 08/04/18 [phone]

## 2018-08-04 NOTE — Discharge Instructions (Addendum)
You should be seeing significant improvement over the next 2 to 3 days.  If not, please return for x-rays

## 2019-12-23 ENCOUNTER — Ambulatory Visit: Payer: Medicare Other | Attending: Internal Medicine

## 2019-12-23 DIAGNOSIS — Z23 Encounter for immunization: Secondary | ICD-10-CM

## 2019-12-23 NOTE — Progress Notes (Signed)
   Covid-19 Vaccination Clinic  Name:  Tami Cabrera    MRN: [id number] DOB: Dec 23, 1932  12/23/2019  Tami Cabrera was observed post Covid-19 immunization for 15 minutes without incidence. She was provided with Vaccine Information Sheet and instruction to access the V-Safe system.   Tami Cabrera was instructed to call 911 with any severe reactions post vaccine: Marland Kitchen Difficulty breathing  . Swelling of your face and throat  . A fast heartbeat  . A bad rash all over your body  . Dizziness and weakness   10:38

## 2020-01-13 ENCOUNTER — Ambulatory Visit: Payer: Medicare PPO | Attending: Internal Medicine

## 2020-01-13 DIAGNOSIS — Z23 Encounter for immunization: Secondary | ICD-10-CM | POA: Insufficient documentation

## 2020-01-13 NOTE — Progress Notes (Signed)
   Covid-19 Vaccination Clinic  Name:  Tami Cabrera    MRN: [id number] DOB: 1933/08/03  01/13/2020  [name] was observed post Covid-19 immunization for 15 minutes without incidence. She was provided with Vaccine Information Sheet and instruction to access the V-Safe system.   [name] was instructed to call 911 with any severe reactions post vaccine: Marland Kitchen Difficulty breathing  . Swelling of your face and throat  . A fast heartbeat  . A bad rash all over your body  . Dizziness and weakness    Immunizations Administered    Name Date Dose VIS Date Route   Pfizer COVID-19 Vaccine 01/13/2020 10:31 AM 0.3 mL 11/16/2019 Intramuscular   Manufacturer: [employer], [employer]   Lot: [id number]   NDC: [address]-1

## 2020-02-05 ENCOUNTER — Other Ambulatory Visit: Payer: Self-pay | Admitting: Endocrinology

## 2021-03-12 ENCOUNTER — Ambulatory Visit: Payer: Medicare PPO | Attending: Internal Medicine

## 2021-03-12 ENCOUNTER — Other Ambulatory Visit: Payer: Self-pay

## 2021-03-12 DIAGNOSIS — Z23 Encounter for immunization: Secondary | ICD-10-CM

## 2021-03-12 NOTE — Progress Notes (Signed)
   Covid-19 Vaccination Clinic  Name:  Tami Cabrera    MRN: [id number] DOB: 29-Mar-1933  03/12/2021  [name] was observed post Covid-19 immunization for 15 minutes without incident. She was provided with Vaccine Information Sheet and instruction to access the V-Safe system.   [name] was instructed to call 911 with any severe reactions post vaccine: Marland Kitchen Difficulty breathing  . Swelling of face and throat  . A fast heartbeat  . A bad rash all over body  . Dizziness and weakness   Immunizations Administered    Name Date Dose VIS Date Route   PFIZER Comrnaty(Gray TOP) Covid-19 Vaccine 03/12/2021  1:14 PM 0.3 mL 11/13/2020 Intramuscular   Manufacturer: [employer], [employer]   Lot: [id number]   NDC: [phone]

## 2021-03-19 ENCOUNTER — Other Ambulatory Visit (HOSPITAL_BASED_OUTPATIENT_CLINIC_OR_DEPARTMENT_OTHER): Payer: Self-pay

## 2021-03-19 MED ORDER — COVID-19 MRNA VACCINE (PFIZER) 30 MCG/0.3ML IM SUSP
INTRAMUSCULAR | 0 refills | Status: DC
  Filled 2021-03-19: qty 0.3, 1d supply, fill #0

## 2022-03-23 ENCOUNTER — Encounter: Payer: Self-pay | Admitting: Gastroenterology

## 2022-04-14 ENCOUNTER — Ambulatory Visit: Payer: Medicare PPO | Admitting: Gastroenterology

## 2022-04-14 ENCOUNTER — Encounter: Payer: Self-pay | Admitting: Gastroenterology

## 2022-04-14 VITALS — BP 114/60 | HR 74 | Ht 64.0 in | Wt 141.6 lb

## 2022-04-14 DIAGNOSIS — R1310 Dysphagia, unspecified: Secondary | ICD-10-CM

## 2022-04-14 NOTE — Progress Notes (Signed)
? ?Referring Provider: Adrian Prince, MD ?Primary Care Physician:  Adrian Prince, MD ? ? ?Reason for Consultation: Possible achalasia ? ? ?IMPRESSION:  ?Solid-food dysphagia without alarm features for 6-9 months ?Abnormal TSH in the setting of hypothyroidism, clinically euthyroid ? ?Dysphagia may be due to presbyesophagus, GERD-related dysmotility, ring, web, or stricture. Thyroid abnormalities can cause dysphagia however she is thought to be euthyroid. No alarm features.  ? ?PLAN: ?- Barium esophagram ?- Obtain records from Dr. Matthias Hughs including prior endoscopic evaluation ?- Office follow-up after the esophagram ? ? ?HPI: Tami Cabrera is a 88 y.o. female referred by Dr. Evlyn Kanner for achalasia.  The history is obtained through the patient, review of her electronic health record, and records provided by Dr. Evlyn Kanner.  She has a history of hypothyroidism, hyperlipidemia, hypertension, osteopenia, esophageal stricture, and prior evaluation for a left lower lung nodule. 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 ? ?On a recent visit with Dr. Evlyn Kanner she reported low sternal chest pain that happens while eating over the last 6 to 9 months.  She feels food going down slowly and occasionally gets stuck. Only occurs with solids. There is associated nausea.  He was concerned about the possibility of achalasia and cardiac spasm. ? ?No difficulty initiating a swallow or passing food from the throat.  ?No sensation of food stuck in the back of the throat, nasal regurgitation, coughing or choking while eating, repetitive swallowing needed to clear the bolus or drooling.  ?No heartburn, regurgitation, vomiting, cough, or chest pain.  ?No odynophagia, dysphonia, or globus.  ?No weight loss, fevers, or night sweats.  ?No history of aspiration pneumonia or recurrent pneumonia. ?No use of antipsychotics, anticholinergics, antimuscarinics, narcotics, or immunosuppressant drugs.  ?Symptoms have not required her to change her diet.  ?She  has not tried any medications or OTC therapies.   ? ?Previously seen by Dr. Matthias Hughs for routine colonoscopy. No prior upper endoscopy. ? ?There is a family history of colon cancer.  Father was a smoker had laryngeal cancer.  There is no known family history of colon cancer or polyps. No family history of stomach cancer or other GI malignancy. No family history of inflammatory bowel disease or celiac.  ? ?Labs from 07/27/2021 show a normal BMP, normal liver enzymes, a TSH of 0.14, and a normal CBC with a hemoglobin of 12.2, MCV 85.4, RDW 14.7, vitamin D normal ? ? ?Past Medical History:  ?Diagnosis Date  ? Achalasia   ? Edema   ? High cholesterol   ? Hypertension, essential   ? Neuropathy   ? Thyroid disease   ? Vitamin B 12 deficiency   ? Vitamin D deficiency   ? ? ?Past Surgical History:  ?Procedure Laterality Date  ? BUNIONECTOMY    ? PARTIAL HYSTERECTOMY    ? ? ?Current Outpatient Medications  ?Medication Sig Dispense Refill  ? aspirin 325 MG tablet Take 325 mg by mouth daily.    ? B Complex-C (B-COMPLEX WITH VITAMIN C) tablet Take 1 tablet by mouth daily.    ? Cyanocobalamin (B-12) 5000 MCG CAPS Take 1 capsule by mouth daily.    ? ezetimibe (ZETIA) 10 MG tablet Take 10 mg by mouth daily.    ? levothyroxine (SYNTHROID) 75 MCG tablet Take 1 tablet (75 mcg total) by mouth daily before breakfast.    ? losartan-hydrochlorothiazide (HYZAAR) 50-12.5 MG tablet Take 1 tablet by mouth daily.    ? MELOXICAM PO Take 1 tablet by mouth as needed.    ?  simvastatin (ZOCOR) 40 MG tablet Take 40 mg by mouth daily.    ? Vitamin D, Ergocalciferol, (DRISDOL) 1.25 MG (50000 UNIT) CAPS capsule Take 50,000 Units by mouth every 7 (seven) days.    ? ?No current facility-administered medications for this visit.  ? ? ?Allergies as of 04/14/2022  ? (No Known Allergies)  ? ? ?Family History  ?Problem Relation Age of Onset  ? Heart disease Mother   ? Heart disease Father   ? Breast cancer Maternal Grandmother   ? Lung cancer Paternal  Grandfather   ? Breast cancer Other   ?     Maternal aunt  ? Liver disease Neg Hx   ? Colon cancer Neg Hx   ? Pancreatic cancer Neg Hx   ? Esophageal cancer Neg Hx   ? Stomach cancer Neg Hx   ? ? ?Social History  ? ?Socioeconomic History  ? Marital status: Widowed  ?  Spouse name: Not on file  ? Number of children: Not on file  ? Years of education: Not on file  ? Highest education level: Not on file  ?Occupational History  ? Not on file  ?Tobacco Use  ? Smoking status: Former  ?  Types: Cigarettes  ?  Quit date: 08/27/1977  ?  Years since quitting: 44.6  ?  Passive exposure: Past  ? Smokeless tobacco: Never  ?Vaping Use  ? Vaping Use: Never used  ?Substance and Sexual Activity  ? Alcohol use: Yes  ?  Comment: rare  ? Drug use: Never  ? Sexual activity: Not Currently  ?Other Topics Concern  ? Not on file  ?Social History Narrative  ? Not on file  ? ?Social Determinants of Health  ? ?Financial Resource Strain: Not on file  ?Food Insecurity: Not on file  ?Transportation Needs: Not on file  ?Physical Activity: Not on file  ?Stress: Not on file  ?Social Connections: Not on file  ?Intimate Partner Violence: Not on file  ? ? ?Review of Systems: ?12 system ROS is negative except as noted above.  ? ?Physical Exam: ?General:   Alert,  well-nourished, pleasant and cooperative in NAD ?Head:  Normocephalic and atraumatic. ?Eyes:  Sclera clear, no icterus.   Conjunctiva pink. ?Ears:  Normal auditory acuity. ?Nose:  No deformity, discharge,  or lesions. ?Mouth:  No deformity or lesions.   ?Neck:  Supple; no masses or thyromegaly. ?Lungs:  Clear throughout to auscultation.   No wheezes. ?Heart:  Regular rate and rhythm; no murmurs. ?Abdomen:  Soft, nontender, nondistended, normal bowel sounds, no rebound or guarding. No hepatosplenomegaly.   ?Rectal:  Deferred  ?Msk:  Symmetrical. No boney deformities ?LAD: No inguinal or umbilical LAD ?Extremities:  No clubbing or edema. ?Neurologic:  Alert and  oriented x4;  grossly  nonfocal ?Skin:  Intact without significant lesions or rashes. ?Psych:  Alert and cooperative. Normal mood and affect. ? ?I spent 45 minutes, including in depth chart review, independent review of results, communicating results with the patient directly, face-to-face time with the patient, coordinating care, ordering studies and medications as appropriate, and documentation.   ? ?Erik Nessel L. Orvan Falconer, MD, MPH ?04/14/2022, 9:12 AM ? ? ? ?  ?

## 2022-04-14 NOTE — Patient Instructions (Addendum)
It was my pleasure to provide care to you today. Based on our discussion, I am providing you with my recommendations below: ? ?RECOMMENDATION(S):  ? ?I have recommended an x-ray to start the evaluation of your swallowing difficulties.  ? ?In the meantime, you might find it easier to incorporate more pureed or liquid foods in your diet. Incorporate soft cooked, mashed or pureed foods; soups, smoothies and crock-pot meals (tender meats and vegetables). Smoothies and protein shakes are especially helpful when appetite or intake is low. Sip small amounts of liquids with meals to ease swallowing and help food slide down the esophagus. Enjoy room temperature or warm liquids. Avoid ice cold drinks which can cause muscle spasms. Add sauces and gravies to moisten food. ? ?Take small bites, chew food thoroughly and limit stressful distractions at meal times. ? ?Do not go to bed immediately after a meal. Allow about three hours after eating before laying down to prevent regurgitation and heartburn. ? ?BARIUM ESOPHAGRAM: ? ?You have been scheduled for a Barium Esophogram at [facility] (1st floor of the hospital) on Friday 04/23/22 at 11 am. Please arrive @ 10:30 am for registration. Please DO NOT eat or drink anything 3 hours prior to your test. This test typically takes about 30 minutes to perform. ? ?NEED TO RESCHEDULE?  ? ?Please call radiology at [phone]. ? ?WHY ARE YOU HAVING THIS EXAM? ? ?A barium swallow is an examination that concentrates on views of the esophagus. This tends to be a double contrast exam (barium and two liquids which, when combined, create a gas to distend the wall of the oesophagus) or single contrast (non-ionic iodine based). The study is usually tailored to your symptoms so a good history is essential. Attention is paid during the study to the form, structure and configuration of the esophagus, looking for functional disorders (such as aspiration, dysphagia, achalasia, motility and  reflux) ? ?EXAMINATION ?You may be asked to change into a gown, depending on the type of swallow being performed. A radiologist and radiographer will perform the procedure. The radiologist will advise you of the type of contrast selected for your procedure and direct you during the exam. You will be asked to stand, sit or lie in several different positions and to hold a small amount of fluid in your mouth before being asked to swallow while the imaging is performed .In some instances you may be asked to swallow barium coated marshmallows to assess the motility of a solid food bolus. The exam can be recorded as a digital or video fluoroscopy procedure. ? ?POST PROCEDURE ?It will take 1-2 days for the barium to pass through your system. To facilitate this, it is important, unless otherwise directed, to increase your fluids for the next 24-48hrs and to resume your normal diet.  ? ?FOLLOW UP: ? ?After your procedure, you will receive a call from my office staff regarding my recommendation for follow up. ? ?BMI: ? ?If you are age 86 or older, your body mass index should be between 23-30. Your Body mass index is 24.31 kg/m?Marland Kitchen If this is out of the aforementioned range listed, please consider follow up with your Primary Care Provider. ? ? ?MY CHART: ? ?The [facility] providers would like to encourage you to use [facility] to communicate with providers for non-urgent requests or questions.  Due to long hold times on the telephone, sending your provider a message by [facility] may be a faster and more efficient way to get a response.  Please allow 48 business hours for a response.  Please remember that this is for non-urgent requests.  ? ?Thank you for trusting me with your gastrointestinal care!   ? ?Tressia Danas, MD, MPH ? ?

## 2022-04-23 ENCOUNTER — Ambulatory Visit (HOSPITAL_COMMUNITY)
Admission: RE | Admit: 2022-04-23 | Discharge: 2022-04-23 | Disposition: A | Discharge status: Home / Self Care | Payer: Medicare PPO | Source: Ambulatory Visit | Attending: Gastroenterology | Admitting: Gastroenterology

## 2022-04-23 DIAGNOSIS — K449 Diaphragmatic hernia without obstruction or gangrene: Secondary | ICD-10-CM | POA: Diagnosis not present

## 2022-04-23 DIAGNOSIS — R1310 Dysphagia, unspecified: Secondary | ICD-10-CM | POA: Diagnosis not present

## 2022-04-26 ENCOUNTER — Telehealth: Payer: Self-pay | Admitting: Gastroenterology

## 2022-04-26 NOTE — Telephone Encounter (Signed)
Patient returned your call, please advise. 

## 2022-04-26 NOTE — Telephone Encounter (Signed)
See imaging result note 5/19. Called & spoke with pt.

## 2022-05-26 ENCOUNTER — Ambulatory Visit: Payer: Medicare PPO | Admitting: Gastroenterology

## 2022-05-26 ENCOUNTER — Encounter: Payer: Self-pay | Admitting: Gastroenterology

## 2022-05-26 VITALS — BP 110/60 | HR 60 | Ht 64.0 in | Wt 142.8 lb

## 2022-05-26 DIAGNOSIS — R1310 Dysphagia, unspecified: Secondary | ICD-10-CM | POA: Diagnosis not present

## 2022-05-26 MED ORDER — PANTOPRAZOLE SODIUM 40 MG PO TBEC: 40.0000 mg | DELAYED_RELEASE_TABLET | Freq: Every day | ORAL | 0 refills | Status: DC

## 2022-05-26 NOTE — Patient Instructions (Addendum)
It was my pleasure to provide care to you today. Based on our discussion, I am providing you with my recommendations below:  RECOMMENDATION(S):  - Empiric trial of pantoprazole 40 mg every morning for 8 weeks - Consider EGD with empiric dilation if symptoms persist despite empiric treatment  We have sent the following medications to your pharmacy for you to pick up at your convenience: Pantoprazole  FOLLOW UP:  I would like for you to follow up with me in 3-4 month, sooner if needed. Please call the office at [phone] to schedule your appointment. After your procedure, you will receive a call from my office staff regarding my recommendation for follow up.  BMI:  If you are age 86 or older, your body mass index should be between 23-30. Your Body mass index is 24.51 kg/m. If this is out of the aforementioned range listed, please consider follow up with your Primary Care Provider.  If you are age 47 or younger, your body mass index should be between 19-25. Your Body mass index is 24.51 kg/m. If this is out of the aformentioned range listed, please consider follow up with your Primary Care Provider.   MY CHART:  The [facility] providers would like to encourage you to use [facility] to communicate with providers for non-urgent requests or questions.  Due to long hold times on the telephone, sending your provider a message by [facility] may be a faster and more efficient way to get a response.  Please allow 48 business hours for a response.  Please remember that this is for non-urgent requests.   Thank you for trusting me with your gastrointestinal care!    Tressia Danas, MD, MPH

## 2022-05-26 NOTE — Progress Notes (Signed)
Referring Provider: Adrian Prince, MD Primary Care Physician:  Adrian Prince, MD   Chief Complaint: Dysphagia   IMPRESSION:  Solid-food dysphagia without alarm features for 6-9 months Transient hiatal hernia seen on esophagram Esophagitis and Schatzki's on prior EGD with Buccini 2008 Abnormal TSH in the setting of hypothyroidism, clinically euthyroid  Source of dysphagia not identified on recent esophagram. Dysphagia may still be due to presbyesophagus, GERD-related dysmotility, ring, web, or less-likely stricture.  We discussed empiric treatment versus EGD with dilation. She would ultimately like to avoid long-term medications but would like to avoid endoscopy. She agrees to a short treatment trial to see if this ultimately prevents her symptoms.    She is reassured by her esophagram and admits that she was most worried about the possibility of esophageal cancer.   PLAN: - Empiric trial of pantoprazole 40 mg every morning for 8 weeks - Consider EGD with empiric dilation if symptoms persist despite empiric PPI treatment - Follow-up in 3-4 months, earlier if earlier   HPI: Tami Cabrera is a 88 y.o. female referred by Dr. Evlyn Kanner last month for for achalasia.  She was seen in the office 04/14/22. The interval  history is obtained through the patient, review of her electronic health record, and records provided by Dr. Evlyn Kanner.  She has a history of hypothyroidism, hyperlipidemia, hypertension, osteopenia, esophageal stricture, and prior evaluation for a left lower lung nodule. 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  She is a former patient of Dr. Buccini's for routine colonoscopy.  At time of consult 04/14/2022 she reported a 6 to 12-month history of low sternal chest pain that happens while eating.  She feels food going down slowly and occasionally gets stuck. Only occurs with solids. There is associated nausea.  She was concerned about the possibility of achalasia and cardiac spasm.   There were no alarm features. No prior upper endoscopy.  Esophagram 04/23/2022 showed a small transient hiatal hernia but was otherwise normal.  She provides a copy of her EGD with Dr. Matthias Hughs from 2008 for anemia showed reflux esophaitis, a small haital hernia, and a mild Schatzki's ring. Colonoscopy at the same time was normal.   Returns today in follow-up. She feels her symptoms are so inconsistent that they may be difficult to prevent. She is reassured by her esophagram and admits that she was most worried about the possibility of esophageal cancer.   Labs from 07/27/2021 show a normal BMP, normal liver enzymes, a TSH of 0.14, and a normal CBC with a hemoglobin of 12.2, MCV 85.4, RDW 14.7, vitamin D normal   Past Medical History:  Diagnosis Date   Achalasia    Edema    High cholesterol    Hypertension, essential    Neuropathy    Thyroid disease    Vitamin B 12 deficiency    Vitamin D deficiency     Past Surgical History:  Procedure Laterality Date   BUNIONECTOMY     PARTIAL HYSTERECTOMY      Current Outpatient Medications  Medication Sig Dispense Refill   aspirin 325 MG tablet Take 325 mg by mouth daily.     B Complex-C (B-COMPLEX WITH VITAMIN C) tablet Take 1 tablet by mouth daily.     Cyanocobalamin (B-12) 5000 MCG CAPS Take 1 capsule by mouth daily.     ezetimibe (ZETIA) 10 MG tablet Take 10 mg by mouth daily.     levothyroxine (SYNTHROID) 75 MCG tablet Take 1 tablet (75 mcg total) by mouth  daily before breakfast.     losartan-hydrochlorothiazide (HYZAAR) 50-12.5 MG tablet Take 1 tablet by mouth daily.     MELOXICAM PO Take 1 tablet by mouth as needed.     pantoprazole (PROTONIX) 40 MG tablet Take 1 tablet (40 mg total) by mouth daily before breakfast. 90 tablet 0   simvastatin (ZOCOR) 40 MG tablet Take 40 mg by mouth daily.     Vitamin D, Ergocalciferol, (DRISDOL) 1.25 MG (50000 UNIT) CAPS capsule Take 50,000 Units by mouth every 7 (seven) days.     No current  facility-administered medications for this visit.    Allergies as of 05/26/2022   (No Known Allergies)        Physical Exam: General:   Alert,  well-nourished, pleasant and cooperative in NAD Head:  Normocephalic and atraumatic. Eyes:  Sclera clear, no icterus.   Conjunctiva pink. Abdomen:  Soft, nontender, nondistended, normal bowel sounds, no rebound or guarding. No hepatosplenomegaly.   Neurologic:  Alert and  oriented x4;  grossly nonfocal Skin:  Intact without significant lesions or rashes. Psych:  Alert and cooperative. Normal mood and affect.   Ambriana Selway L. Orvan Falconer, MD, MPH 05/26/2022, 11:39 AM

## 2022-07-21 DIAGNOSIS — E785 Hyperlipidemia, unspecified: Secondary | ICD-10-CM | POA: Diagnosis not present

## 2022-07-21 DIAGNOSIS — R7989 Other specified abnormal findings of blood chemistry: Secondary | ICD-10-CM | POA: Diagnosis not present

## 2022-07-21 DIAGNOSIS — I10 Essential (primary) hypertension: Secondary | ICD-10-CM | POA: Diagnosis not present

## 2022-07-21 DIAGNOSIS — R7302 Impaired glucose tolerance (oral): Secondary | ICD-10-CM | POA: Diagnosis not present

## 2022-07-21 DIAGNOSIS — E559 Vitamin D deficiency, unspecified: Secondary | ICD-10-CM | POA: Diagnosis not present

## 2022-07-21 DIAGNOSIS — E039 Hypothyroidism, unspecified: Secondary | ICD-10-CM | POA: Diagnosis not present

## 2022-07-21 DIAGNOSIS — D649 Anemia, unspecified: Secondary | ICD-10-CM | POA: Diagnosis not present

## 2022-07-28 DIAGNOSIS — I5189 Other ill-defined heart diseases: Secondary | ICD-10-CM | POA: Diagnosis not present

## 2022-07-28 DIAGNOSIS — R82998 Other abnormal findings in urine: Secondary | ICD-10-CM | POA: Diagnosis not present

## 2022-07-28 DIAGNOSIS — Z0000 Encounter for general adult medical examination without abnormal findings: Secondary | ICD-10-CM | POA: Diagnosis not present

## 2022-07-28 DIAGNOSIS — E785 Hyperlipidemia, unspecified: Secondary | ICD-10-CM | POA: Diagnosis not present

## 2022-07-28 DIAGNOSIS — Z1331 Encounter for screening for depression: Secondary | ICD-10-CM | POA: Diagnosis not present

## 2022-07-28 DIAGNOSIS — E039 Hypothyroidism, unspecified: Secondary | ICD-10-CM | POA: Diagnosis not present

## 2022-07-28 DIAGNOSIS — E538 Deficiency of other specified B group vitamins: Secondary | ICD-10-CM | POA: Diagnosis not present

## 2022-07-28 DIAGNOSIS — R7302 Impaired glucose tolerance (oral): Secondary | ICD-10-CM | POA: Diagnosis not present

## 2022-07-28 DIAGNOSIS — M8580 Other specified disorders of bone density and structure, unspecified site: Secondary | ICD-10-CM | POA: Diagnosis not present

## 2022-07-28 DIAGNOSIS — I10 Essential (primary) hypertension: Secondary | ICD-10-CM | POA: Diagnosis not present

## 2022-07-28 DIAGNOSIS — G629 Polyneuropathy, unspecified: Secondary | ICD-10-CM | POA: Diagnosis not present

## 2022-07-28 DIAGNOSIS — Z1389 Encounter for screening for other disorder: Secondary | ICD-10-CM | POA: Diagnosis not present

## 2022-07-28 DIAGNOSIS — Z23 Encounter for immunization: Secondary | ICD-10-CM | POA: Diagnosis not present

## 2022-08-24 ENCOUNTER — Telehealth: Payer: Self-pay | Admitting: Gastroenterology

## 2022-08-24 MED ORDER — PANTOPRAZOLE SODIUM 40 MG PO TBEC: 40.0000 mg | DELAYED_RELEASE_TABLET | Freq: Every day | ORAL | 1 refills | Status: DC

## 2022-08-24 NOTE — Telephone Encounter (Signed)
Script sent to pharmacy.

## 2022-08-24 NOTE — Telephone Encounter (Signed)
Inbound call from patient stating that she needs a refill for PROTONIX. Please advise.

## 2022-10-30 DIAGNOSIS — B9689 Other specified bacterial agents as the cause of diseases classified elsewhere: Secondary | ICD-10-CM | POA: Diagnosis not present

## 2022-10-30 DIAGNOSIS — R52 Pain, unspecified: Secondary | ICD-10-CM | POA: Diagnosis not present

## 2022-10-30 DIAGNOSIS — R5383 Other fatigue: Secondary | ICD-10-CM | POA: Diagnosis not present

## 2022-10-30 DIAGNOSIS — R051 Acute cough: Secondary | ICD-10-CM | POA: Diagnosis not present

## 2022-10-30 DIAGNOSIS — R0981 Nasal congestion: Secondary | ICD-10-CM | POA: Diagnosis not present

## 2022-11-15 ENCOUNTER — Other Ambulatory Visit (HOSPITAL_BASED_OUTPATIENT_CLINIC_OR_DEPARTMENT_OTHER): Payer: Self-pay

## 2022-11-15 MED ORDER — FLUAD QUADRIVALENT 0.5 ML IM PRSY
PREFILLED_SYRINGE | INTRAMUSCULAR | 0 refills | Status: AC
  Filled 2022-11-15: qty 0.5, 1d supply, fill #0

## 2022-11-16 ENCOUNTER — Other Ambulatory Visit (HOSPITAL_BASED_OUTPATIENT_CLINIC_OR_DEPARTMENT_OTHER): Payer: Self-pay

## 2022-11-16 MED ORDER — COMIRNATY 30 MCG/0.3ML IM SUSY
PREFILLED_SYRINGE | INTRAMUSCULAR | 0 refills | Status: AC
  Filled 2022-11-16: qty 0.3, 1d supply, fill #0

## 2022-12-10 DIAGNOSIS — H524 Presbyopia: Secondary | ICD-10-CM | POA: Diagnosis not present

## 2022-12-10 DIAGNOSIS — H26491 Other secondary cataract, right eye: Secondary | ICD-10-CM | POA: Diagnosis not present

## 2022-12-10 DIAGNOSIS — Z961 Presence of intraocular lens: Secondary | ICD-10-CM | POA: Diagnosis not present

## 2023-02-09 DIAGNOSIS — R609 Edema, unspecified: Secondary | ICD-10-CM | POA: Diagnosis not present

## 2023-02-09 DIAGNOSIS — M8580 Other specified disorders of bone density and structure, unspecified site: Secondary | ICD-10-CM | POA: Diagnosis not present

## 2023-02-09 DIAGNOSIS — E039 Hypothyroidism, unspecified: Secondary | ICD-10-CM | POA: Diagnosis not present

## 2023-02-09 DIAGNOSIS — I5189 Other ill-defined heart diseases: Secondary | ICD-10-CM | POA: Diagnosis not present

## 2023-02-09 DIAGNOSIS — E538 Deficiency of other specified B group vitamins: Secondary | ICD-10-CM | POA: Diagnosis not present

## 2023-02-09 DIAGNOSIS — E559 Vitamin D deficiency, unspecified: Secondary | ICD-10-CM | POA: Diagnosis not present

## 2023-02-09 DIAGNOSIS — I10 Essential (primary) hypertension: Secondary | ICD-10-CM | POA: Diagnosis not present

## 2023-02-09 DIAGNOSIS — G629 Polyneuropathy, unspecified: Secondary | ICD-10-CM | POA: Diagnosis not present

## 2023-02-09 DIAGNOSIS — M25551 Pain in right hip: Secondary | ICD-10-CM | POA: Diagnosis not present

## 2023-02-23 DIAGNOSIS — M5416 Radiculopathy, lumbar region: Secondary | ICD-10-CM | POA: Diagnosis not present

## 2023-03-01 ENCOUNTER — Other Ambulatory Visit: Payer: Self-pay | Admitting: Gastroenterology

## 2023-05-04 DIAGNOSIS — Z1231 Encounter for screening mammogram for malignant neoplasm of breast: Secondary | ICD-10-CM | POA: Diagnosis not present

## 2023-08-18 DIAGNOSIS — E039 Hypothyroidism, unspecified: Secondary | ICD-10-CM | POA: Diagnosis not present

## 2023-08-18 DIAGNOSIS — R7302 Impaired glucose tolerance (oral): Secondary | ICD-10-CM | POA: Diagnosis not present

## 2023-08-18 DIAGNOSIS — I5189 Other ill-defined heart diseases: Secondary | ICD-10-CM | POA: Diagnosis not present

## 2023-08-18 DIAGNOSIS — D649 Anemia, unspecified: Secondary | ICD-10-CM | POA: Diagnosis not present

## 2023-08-18 DIAGNOSIS — I119 Hypertensive heart disease without heart failure: Secondary | ICD-10-CM | POA: Diagnosis not present

## 2023-08-18 DIAGNOSIS — E785 Hyperlipidemia, unspecified: Secondary | ICD-10-CM | POA: Diagnosis not present

## 2023-08-18 DIAGNOSIS — E559 Vitamin D deficiency, unspecified: Secondary | ICD-10-CM | POA: Diagnosis not present

## 2023-09-08 DIAGNOSIS — K220 Achalasia of cardia: Secondary | ICD-10-CM | POA: Diagnosis not present

## 2023-09-08 DIAGNOSIS — I10 Essential (primary) hypertension: Secondary | ICD-10-CM | POA: Diagnosis not present

## 2023-09-08 DIAGNOSIS — R82998 Other abnormal findings in urine: Secondary | ICD-10-CM | POA: Diagnosis not present

## 2023-09-08 DIAGNOSIS — E538 Deficiency of other specified B group vitamins: Secondary | ICD-10-CM | POA: Diagnosis not present

## 2023-09-08 DIAGNOSIS — Z23 Encounter for immunization: Secondary | ICD-10-CM | POA: Diagnosis not present

## 2023-09-08 DIAGNOSIS — E785 Hyperlipidemia, unspecified: Secondary | ICD-10-CM | POA: Diagnosis not present

## 2023-09-08 DIAGNOSIS — Z1339 Encounter for screening examination for other mental health and behavioral disorders: Secondary | ICD-10-CM | POA: Diagnosis not present

## 2023-09-08 DIAGNOSIS — R911 Solitary pulmonary nodule: Secondary | ICD-10-CM | POA: Diagnosis not present

## 2023-09-08 DIAGNOSIS — Z0000 Encounter for general adult medical examination without abnormal findings: Secondary | ICD-10-CM | POA: Diagnosis not present

## 2023-09-08 DIAGNOSIS — Z1331 Encounter for screening for depression: Secondary | ICD-10-CM | POA: Diagnosis not present

## 2023-09-08 DIAGNOSIS — I119 Hypertensive heart disease without heart failure: Secondary | ICD-10-CM | POA: Diagnosis not present

## 2023-09-08 DIAGNOSIS — G629 Polyneuropathy, unspecified: Secondary | ICD-10-CM | POA: Diagnosis not present

## 2023-09-08 DIAGNOSIS — E039 Hypothyroidism, unspecified: Secondary | ICD-10-CM | POA: Diagnosis not present

## 2023-10-12 ENCOUNTER — Other Ambulatory Visit (HOSPITAL_BASED_OUTPATIENT_CLINIC_OR_DEPARTMENT_OTHER): Payer: Self-pay

## 2023-10-12 MED ORDER — COMIRNATY 30 MCG/0.3ML IM SUSY
0.3000 mL | PREFILLED_SYRINGE | Freq: Once | INTRAMUSCULAR | 0 refills | Status: AC
  Filled 2023-10-12: qty 0.3, 1d supply, fill #0

## 2023-12-12 DIAGNOSIS — H04123 Dry eye syndrome of bilateral lacrimal glands: Secondary | ICD-10-CM | POA: Diagnosis not present

## 2023-12-12 DIAGNOSIS — H01004 Unspecified blepharitis left upper eyelid: Secondary | ICD-10-CM | POA: Diagnosis not present

## 2023-12-12 DIAGNOSIS — H01001 Unspecified blepharitis right upper eyelid: Secondary | ICD-10-CM | POA: Diagnosis not present

## 2023-12-12 DIAGNOSIS — H5213 Myopia, bilateral: Secondary | ICD-10-CM | POA: Diagnosis not present

## 2023-12-12 DIAGNOSIS — Z961 Presence of intraocular lens: Secondary | ICD-10-CM | POA: Diagnosis not present

## 2024-01-11 DIAGNOSIS — L0100 Impetigo, unspecified: Secondary | ICD-10-CM | POA: Diagnosis not present

## 2024-01-18 DIAGNOSIS — L218 Other seborrheic dermatitis: Secondary | ICD-10-CM | POA: Diagnosis not present

## 2024-02-28 IMAGING — RF DG ESOPHAGUS
9 of 10 series · 14 of 24 positions shown · non-contrast
Comparison: None Available.

CLINICAL DATA: Patient with history of intermittent dysphagia with
solids. Request is for esophagram

EXAM:
ESOPHAGUS/BARIUM SWALLOW/TABLET STUDY
TECHNIQUE: Single contrast examination was performed using thin liquid barium.
This exam was performed by Deibi J Argeta Nurse Practitioner,
and was supervised and interpreted by Dr. Mang-Abe Zagami.
FLUOROSCOPY:
Radiation Exposure Index (as provided by the fluoroscopic device):
6.2 mGy Kerma

[Series 1: cp_standard · 2 of 75 frames shown (1 of 7)]
[frame 12/75]
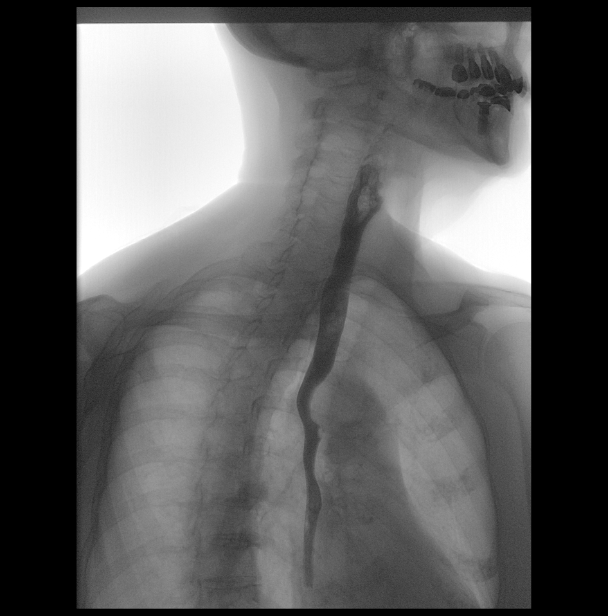
[frame 64/75]
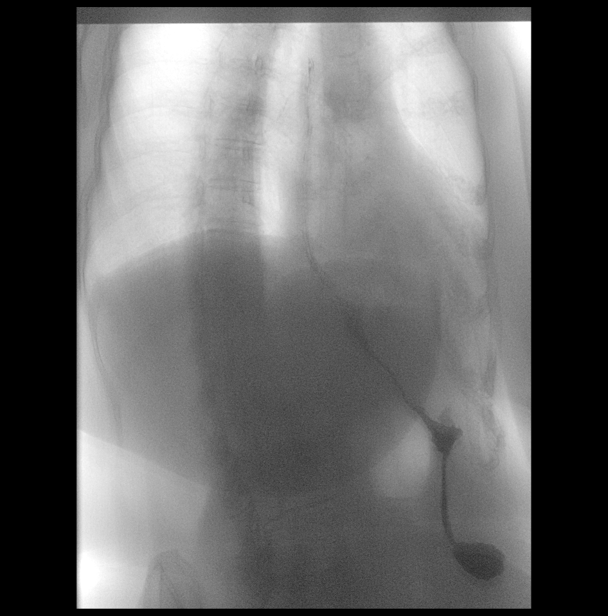

[Series 3: fluoro_barium 2fps_bw · 1 of 5 frames shown (1 of 2)]
[frame 1/5]
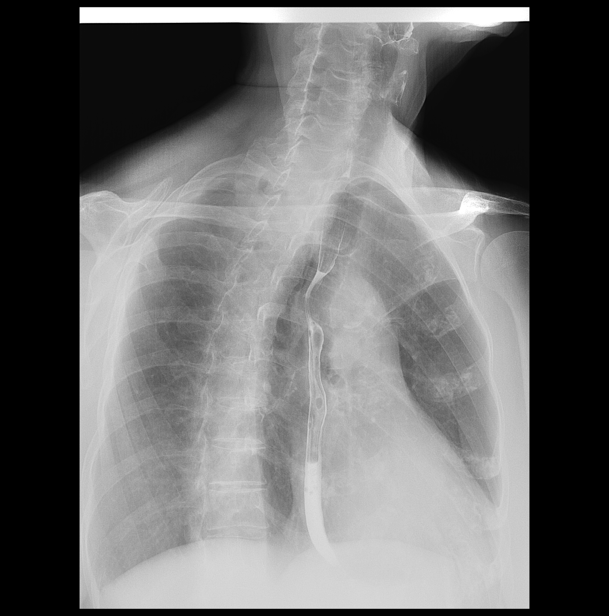

[Series 4: fluoro_barium 2fps_bw · 2 of 6 frames shown (2 of 2)]
[frame 1/6]
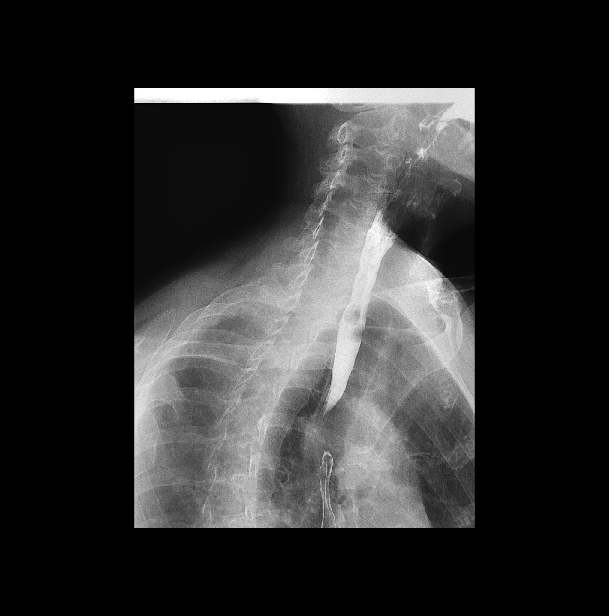
[frame 3/6]
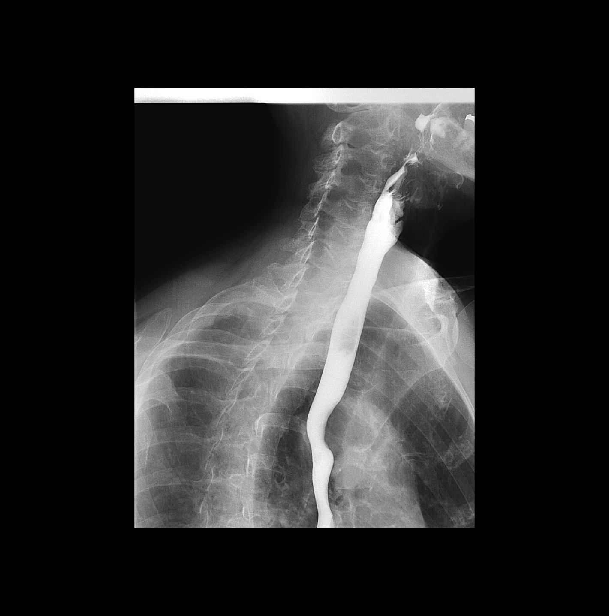

[Series 5: cp_standard · 1 of 80 frames shown (2 of 7)]
[frame 13/80]
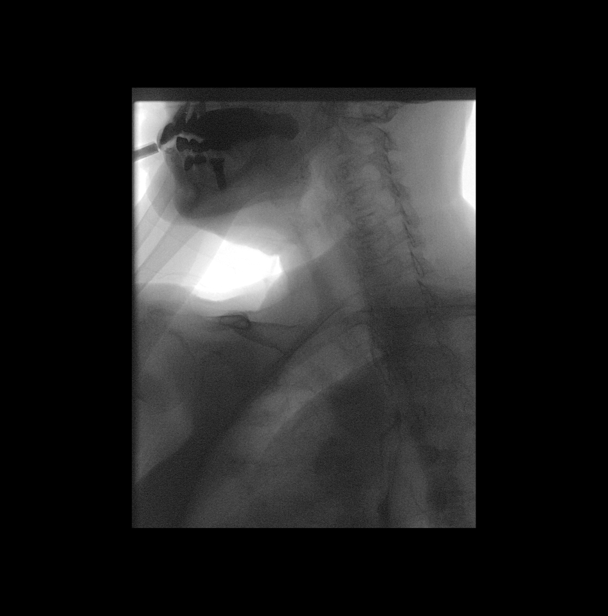

[Series 6: cp_standard · 2 of 154 frames shown (3 of 7)]
[frame 9/154]
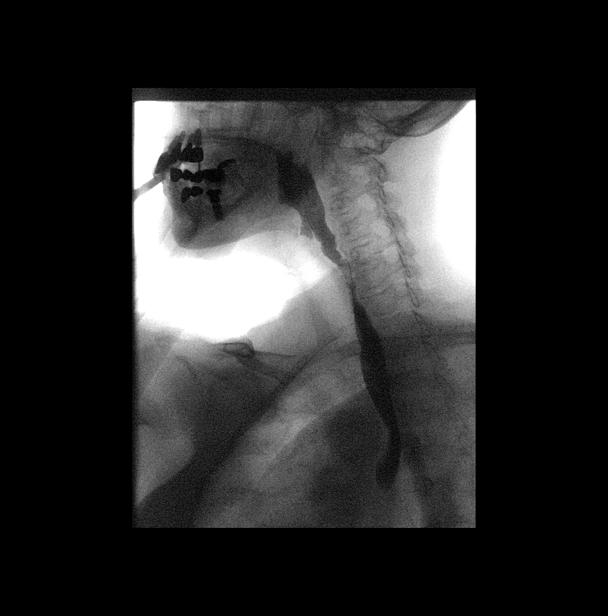
[frame 24/154]
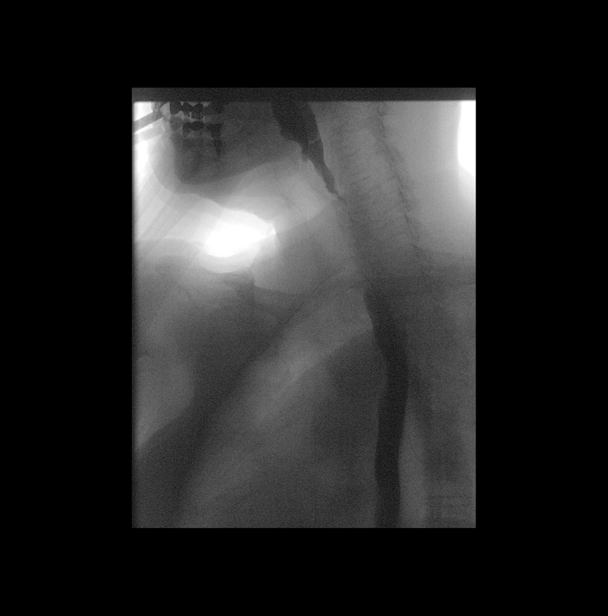

[Series 7: cp_standard · 1 of 94 frames shown (4 of 7)]
[frame 38/94]
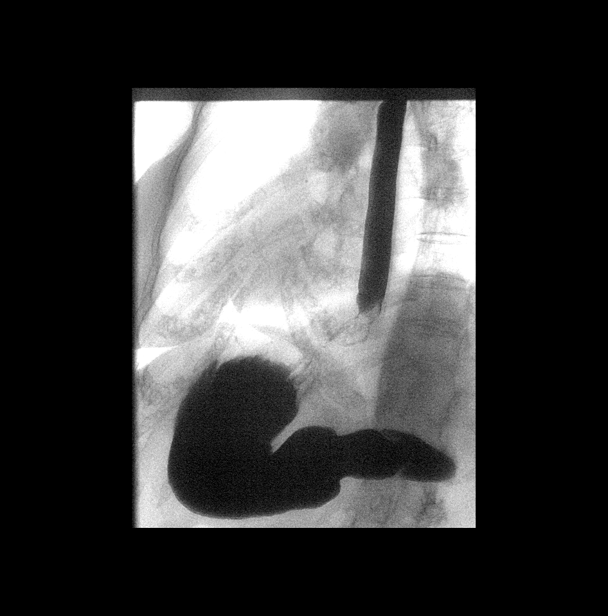

[Series 8: cp_standard · 2 of 27 frames shown (5 of 7)]
[frame 5/27]
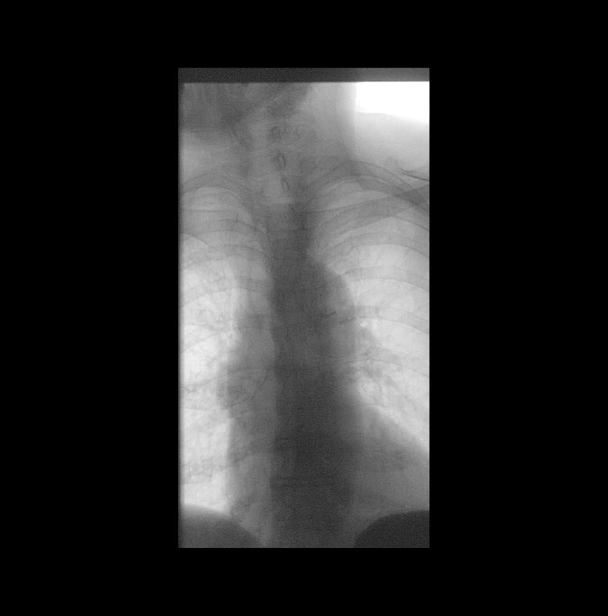
[frame 27/27]
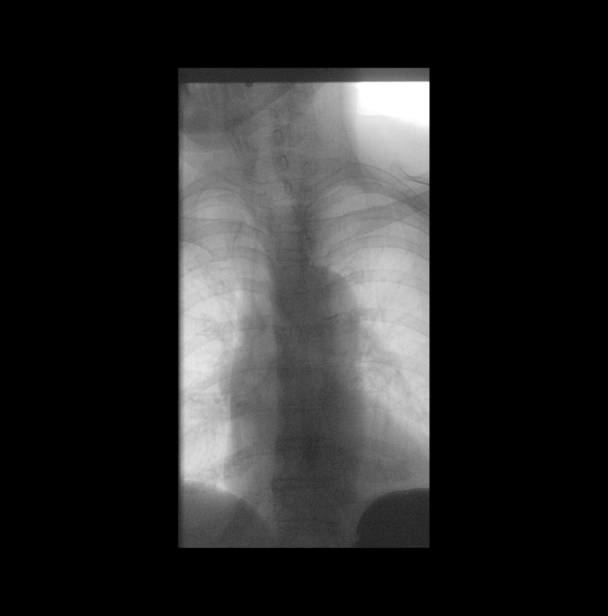

[Series 9: cp_standard · 1 of 75 frames shown (6 of 7)]
[frame 38/75]
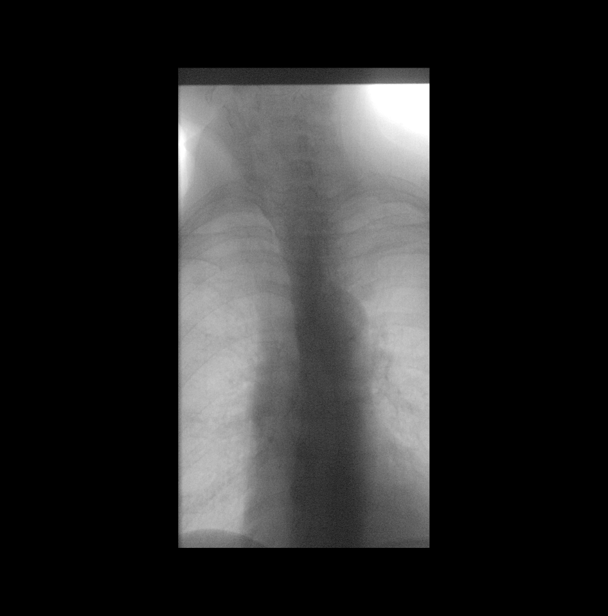

[Series 10: cp_standard · 2 of 17 frames shown (7 of 7)]
[frame 3/17]
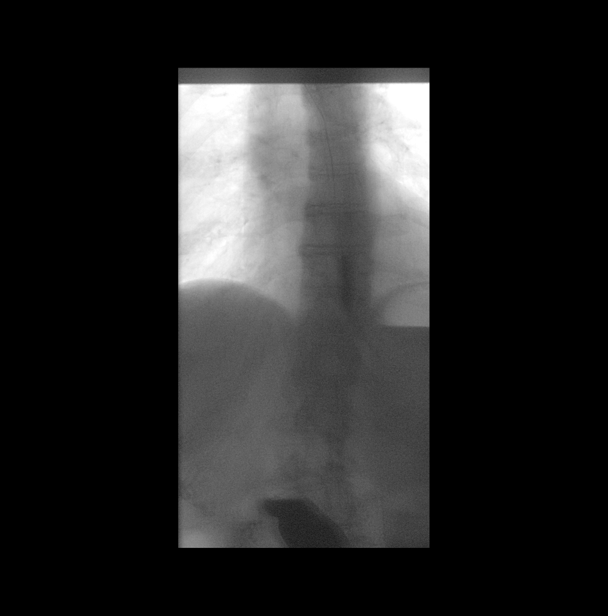
[frame 16/17]
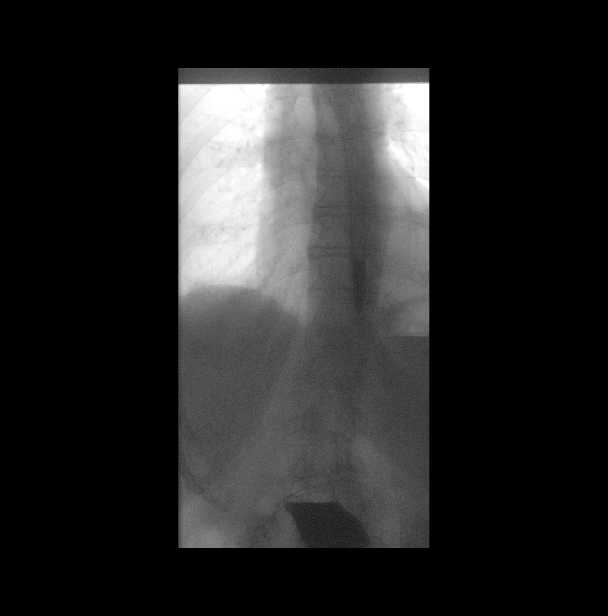

[14 of 24 positions shown; findings below may reference images not displayed]

FINDINGS: Swallowing: Appears normal. No vestibular penetration or aspiration
seen.

Pharynx: Unremarkable.

Esophagus: Normal appearance.

Esophageal motility: Within normal limits.

Hiatal Hernia: Small transient hiatal hernia noted.

Gastroesophageal reflux: None visualized.

Ingested 13mm barium tablet: Passed normally

Other: None.
IMPRESSION: 1.  Small transient hiatal hernia noted.

## 2024-03-07 DIAGNOSIS — E538 Deficiency of other specified B group vitamins: Secondary | ICD-10-CM | POA: Diagnosis not present

## 2024-03-07 DIAGNOSIS — D649 Anemia, unspecified: Secondary | ICD-10-CM | POA: Diagnosis not present

## 2024-03-07 DIAGNOSIS — E039 Hypothyroidism, unspecified: Secondary | ICD-10-CM | POA: Diagnosis not present

## 2024-03-07 DIAGNOSIS — E785 Hyperlipidemia, unspecified: Secondary | ICD-10-CM | POA: Diagnosis not present

## 2024-03-07 DIAGNOSIS — M8580 Other specified disorders of bone density and structure, unspecified site: Secondary | ICD-10-CM | POA: Diagnosis not present

## 2024-03-07 DIAGNOSIS — R7302 Impaired glucose tolerance (oral): Secondary | ICD-10-CM | POA: Diagnosis not present

## 2024-03-07 DIAGNOSIS — G629 Polyneuropathy, unspecified: Secondary | ICD-10-CM | POA: Diagnosis not present

## 2024-03-07 DIAGNOSIS — I119 Hypertensive heart disease without heart failure: Secondary | ICD-10-CM | POA: Diagnosis not present

## 2024-06-12 DIAGNOSIS — Z1231 Encounter for screening mammogram for malignant neoplasm of breast: Secondary | ICD-10-CM | POA: Diagnosis not present

## 2024-08-24 ENCOUNTER — Other Ambulatory Visit (HOSPITAL_BASED_OUTPATIENT_CLINIC_OR_DEPARTMENT_OTHER): Payer: Self-pay

## 2024-08-24 MED ORDER — COMIRNATY 30 MCG/0.3ML IM SUSY
0.3000 mL | PREFILLED_SYRINGE | Freq: Once | INTRAMUSCULAR | 0 refills | Status: AC
  Filled 2024-08-24: qty 0.3, 1d supply, fill #0

## 2024-09-05 DIAGNOSIS — Z125 Encounter for screening for malignant neoplasm of prostate: Secondary | ICD-10-CM | POA: Diagnosis not present

## 2024-09-05 DIAGNOSIS — I10 Essential (primary) hypertension: Secondary | ICD-10-CM | POA: Diagnosis not present

## 2024-09-05 DIAGNOSIS — D649 Anemia, unspecified: Secondary | ICD-10-CM | POA: Diagnosis not present

## 2024-09-05 DIAGNOSIS — E039 Hypothyroidism, unspecified: Secondary | ICD-10-CM | POA: Diagnosis not present

## 2024-09-05 DIAGNOSIS — R7302 Impaired glucose tolerance (oral): Secondary | ICD-10-CM | POA: Diagnosis not present

## 2024-09-05 DIAGNOSIS — Z0189 Encounter for other specified special examinations: Secondary | ICD-10-CM | POA: Diagnosis not present

## 2024-09-05 DIAGNOSIS — E785 Hyperlipidemia, unspecified: Secondary | ICD-10-CM | POA: Diagnosis not present

## 2024-09-05 DIAGNOSIS — E559 Vitamin D deficiency, unspecified: Secondary | ICD-10-CM | POA: Diagnosis not present

## 2024-09-12 DIAGNOSIS — Z1331 Encounter for screening for depression: Secondary | ICD-10-CM | POA: Diagnosis not present

## 2024-09-12 DIAGNOSIS — D649 Anemia, unspecified: Secondary | ICD-10-CM | POA: Diagnosis not present

## 2024-09-12 DIAGNOSIS — I129 Hypertensive chronic kidney disease with stage 1 through stage 4 chronic kidney disease, or unspecified chronic kidney disease: Secondary | ICD-10-CM | POA: Diagnosis not present

## 2024-09-12 DIAGNOSIS — E039 Hypothyroidism, unspecified: Secondary | ICD-10-CM | POA: Diagnosis not present

## 2024-09-12 DIAGNOSIS — E785 Hyperlipidemia, unspecified: Secondary | ICD-10-CM | POA: Diagnosis not present

## 2024-09-12 DIAGNOSIS — N1831 Chronic kidney disease, stage 3a: Secondary | ICD-10-CM | POA: Diagnosis not present

## 2024-09-12 DIAGNOSIS — G629 Polyneuropathy, unspecified: Secondary | ICD-10-CM | POA: Diagnosis not present

## 2024-09-12 DIAGNOSIS — E538 Deficiency of other specified B group vitamins: Secondary | ICD-10-CM | POA: Diagnosis not present

## 2024-09-12 DIAGNOSIS — Z23 Encounter for immunization: Secondary | ICD-10-CM | POA: Diagnosis not present

## 2024-09-12 DIAGNOSIS — Z0000 Encounter for general adult medical examination without abnormal findings: Secondary | ICD-10-CM | POA: Diagnosis not present

## 2024-09-12 DIAGNOSIS — Z1339 Encounter for screening examination for other mental health and behavioral disorders: Secondary | ICD-10-CM | POA: Diagnosis not present

## 2024-09-12 DIAGNOSIS — M8580 Other specified disorders of bone density and structure, unspecified site: Secondary | ICD-10-CM | POA: Diagnosis not present

## 2024-10-31 DIAGNOSIS — M25521 Pain in right elbow: Secondary | ICD-10-CM | POA: Diagnosis not present

## 2024-10-31 DIAGNOSIS — L03113 Cellulitis of right upper limb: Secondary | ICD-10-CM | POA: Diagnosis not present
